# Patient Record
Sex: Male | Born: 1950 | Race: Black or African American | Hispanic: No | Marital: Married | Smoking: Former smoker
Health system: Southern US, Community
[De-identification: ages and names within clinical notes are randomized; demographics above are authoritative.]

## PROBLEM LIST (undated history)

## (undated) DIAGNOSIS — Z973 Presence of spectacles and contact lenses: Secondary | ICD-10-CM

## (undated) DIAGNOSIS — E785 Hyperlipidemia, unspecified: Secondary | ICD-10-CM

## (undated) DIAGNOSIS — I10 Essential (primary) hypertension: Secondary | ICD-10-CM

## (undated) DIAGNOSIS — R0683 Snoring: Secondary | ICD-10-CM

## (undated) DIAGNOSIS — H9190 Unspecified hearing loss, unspecified ear: Secondary | ICD-10-CM

## (undated) DIAGNOSIS — Z974 Presence of external hearing-aid: Secondary | ICD-10-CM

## (undated) DIAGNOSIS — K219 Gastro-esophageal reflux disease without esophagitis: Secondary | ICD-10-CM

## (undated) HISTORY — PX: TONSILLECTOMY: SUR1361

## (undated) HISTORY — PX: COLONOSCOPY: SHX174

---

## 2007-03-08 HISTORY — PX: KNEE ARTHROSCOPY: SUR90

## 2013-02-26 ENCOUNTER — Other Ambulatory Visit: Payer: Self-pay | Admitting: Orthopedic Surgery

## 2013-03-18 ENCOUNTER — Encounter (HOSPITAL_BASED_OUTPATIENT_CLINIC_OR_DEPARTMENT_OTHER): Payer: Self-pay | Admitting: *Deleted

## 2013-03-18 NOTE — Progress Notes (Signed)
03/18/13 [place]  Have you ever been diagnosed with sleep apnea through a sleep study? No  Do you snore loudly (loud enough to be heard through closed doors)?  1  Do you often feel tired, fatigued, or sleepy during the daytime? 0  Has anyone observed you stop breathing during your sleep? 0  Do you have, or are you being treated for high blood pressure? 1  BMI more than 35 kg/m2? 0  Age over 61 years old? 1  Neck circumference greater than 40 cm/18 inches? 0  Gender: 1  Obstructive Sleep Apnea Score 4  Score 4 or greater  Results sent to PCP

## 2013-03-18 NOTE — Progress Notes (Signed)
To come in for ekg-bmet 

## 2013-03-19 ENCOUNTER — Encounter (HOSPITAL_BASED_OUTPATIENT_CLINIC_OR_DEPARTMENT_OTHER)
Admission: RE | Admit: 2013-03-19 | Discharge: 2013-03-19 | Disposition: A | Discharge status: Home / Self Care | Payer: BC Managed Care – PPO | Source: Ambulatory Visit | Attending: Orthopedic Surgery | Admitting: Orthopedic Surgery

## 2013-03-19 DIAGNOSIS — Z01818 Encounter for other preprocedural examination: Secondary | ICD-10-CM | POA: Insufficient documentation

## 2013-03-19 DIAGNOSIS — Z01812 Encounter for preprocedural laboratory examination: Secondary | ICD-10-CM | POA: Insufficient documentation

## 2013-03-19 DIAGNOSIS — Z01810 Encounter for preprocedural cardiovascular examination: Secondary | ICD-10-CM | POA: Insufficient documentation

## 2013-03-19 LAB — BASIC METABOLIC PANEL
BUN: 14 mg/dL (ref 6–23)
CHLORIDE: 95 meq/L — AB (ref 96–112)
CO2: 30 meq/L (ref 19–32)
CREATININE: 0.84 mg/dL (ref 0.50–1.35)
Calcium: 9.5 mg/dL (ref 8.4–10.5)
GFR calc Af Amer: >90 mL/min (ref >90)
GFR calc non Af Amer: >90 mL/min (ref >90)
Glucose, Bld: 105 mg/dL — ABNORMAL HIGH (ref 70–99)
Potassium: 3.9 mEq/L (ref 3.7–5.3)
SODIUM: 138 meq/L (ref 137–147)

## 2013-03-21 ENCOUNTER — Ambulatory Visit (HOSPITAL_BASED_OUTPATIENT_CLINIC_OR_DEPARTMENT_OTHER)
Admission: RE | Admit: 2013-03-21 | Payer: BC Managed Care – PPO | Source: Ambulatory Visit | Admitting: Orthopedic Surgery

## 2013-03-21 HISTORY — DX: Presence of spectacles and contact lenses: Z97.3

## 2013-03-21 HISTORY — DX: Essential (primary) hypertension: I10

## 2013-03-21 HISTORY — DX: Gastro-esophageal reflux disease without esophagitis: K21.9

## 2013-03-21 HISTORY — DX: Unspecified hearing loss, unspecified ear: H91.90

## 2013-03-21 HISTORY — DX: Hyperlipidemia, unspecified: E78.5

## 2013-03-21 HISTORY — DX: Presence of external hearing-aid: Z97.4

## 2013-03-21 HISTORY — DX: Snoring: R06.83

## 2013-03-21 SURGERY — REPAIR, TENDON, EXTENSOR
Anesthesia: Choice | Site: Hand | Laterality: Left

## 2013-03-25 ENCOUNTER — Encounter (HOSPITAL_BASED_OUTPATIENT_CLINIC_OR_DEPARTMENT_OTHER): Payer: Self-pay | Admitting: *Deleted

## 2013-03-25 NOTE — Progress Notes (Signed)
Surgery r/s due to dr sick-had ekg and bmet done

## 2013-03-28 ENCOUNTER — Encounter (HOSPITAL_BASED_OUTPATIENT_CLINIC_OR_DEPARTMENT_OTHER): Payer: Self-pay | Admitting: Anesthesiology

## 2013-03-28 ENCOUNTER — Encounter (HOSPITAL_BASED_OUTPATIENT_CLINIC_OR_DEPARTMENT_OTHER): Payer: BC Managed Care – PPO | Admitting: Anesthesiology

## 2013-03-28 ENCOUNTER — Ambulatory Visit (HOSPITAL_BASED_OUTPATIENT_CLINIC_OR_DEPARTMENT_OTHER)
Admission: RE | Admit: 2013-03-28 | Discharge: 2013-03-28 | Disposition: A | Discharge status: Home / Self Care | Payer: BC Managed Care – PPO | Source: Ambulatory Visit | Attending: Orthopedic Surgery | Admitting: Orthopedic Surgery

## 2013-03-28 ENCOUNTER — Ambulatory Visit (HOSPITAL_BASED_OUTPATIENT_CLINIC_OR_DEPARTMENT_OTHER): Payer: BC Managed Care – PPO | Admitting: Anesthesiology

## 2013-03-28 ENCOUNTER — Encounter (HOSPITAL_BASED_OUTPATIENT_CLINIC_OR_DEPARTMENT_OTHER)
Admission: RE | Disposition: A | Discharge status: Home / Self Care | Payer: Self-pay | Source: Ambulatory Visit | Attending: Orthopedic Surgery

## 2013-03-28 DIAGNOSIS — W261XXA Contact with sword or dagger, initial encounter: Secondary | ICD-10-CM

## 2013-03-28 DIAGNOSIS — S61209A Unspecified open wound of unspecified finger without damage to nail, initial encounter: Secondary | ICD-10-CM | POA: Insufficient documentation

## 2013-03-28 DIAGNOSIS — W260XXA Contact with knife, initial encounter: Secondary | ICD-10-CM | POA: Insufficient documentation

## 2013-03-28 DIAGNOSIS — K219 Gastro-esophageal reflux disease without esophagitis: Secondary | ICD-10-CM | POA: Insufficient documentation

## 2013-03-28 DIAGNOSIS — Z87891 Personal history of nicotine dependence: Secondary | ICD-10-CM | POA: Insufficient documentation

## 2013-03-28 DIAGNOSIS — E785 Hyperlipidemia, unspecified: Secondary | ICD-10-CM | POA: Insufficient documentation

## 2013-03-28 DIAGNOSIS — I10 Essential (primary) hypertension: Secondary | ICD-10-CM | POA: Insufficient documentation

## 2013-03-28 HISTORY — PX: TENDON TRANSFER: SHX6109

## 2013-03-28 LAB — POCT HEMOGLOBIN-HEMACUE: Hemoglobin: 13.6 g/dL (ref 13.0–17.0)

## 2013-03-28 SURGERY — TRANSFER, TENDON
Anesthesia: General | Site: Hand | Laterality: Left

## 2013-03-28 MED ORDER — ONDANSETRON HCL 4 MG/2ML IJ SOLN
INTRAMUSCULAR | Status: DC | PRN
  Administered 2013-03-28: 4 mg via INTRAVENOUS

## 2013-03-28 MED ORDER — MIDAZOLAM HCL 2 MG/2ML IJ SOLN
INTRAMUSCULAR | Status: AC
  Filled 2013-03-28: qty 2

## 2013-03-28 MED ORDER — MIDAZOLAM HCL 2 MG/2ML IJ SOLN: 1.0000 mg | INTRAMUSCULAR | Status: DC | PRN

## 2013-03-28 MED ORDER — PROMETHAZINE HCL 25 MG/ML IJ SOLN: 6.2500 mg | INTRAMUSCULAR | Status: DC | PRN

## 2013-03-28 MED ORDER — PROPOFOL 10 MG/ML IV BOLUS
INTRAVENOUS | Status: DC | PRN
  Administered 2013-03-28: 250 mg via INTRAVENOUS

## 2013-03-28 MED ORDER — OXYCODONE HCL 5 MG/5ML PO SOLN: 5.0000 mg | Freq: Once | ORAL | Status: DC | PRN

## 2013-03-28 MED ORDER — FENTANYL CITRATE 0.05 MG/ML IJ SOLN
INTRAMUSCULAR | Status: AC
  Filled 2013-03-28: qty 4

## 2013-03-28 MED ORDER — ACETAMINOPHEN-CODEINE #3 300-30 MG PO TABS
1.0000 | ORAL_TABLET | Freq: Once | ORAL | Status: AC | PRN
  Administered 2013-03-28: 1 via ORAL
  Filled 2013-03-28: qty 1

## 2013-03-28 MED ORDER — CEFAZOLIN SODIUM-DEXTROSE 2-3 GM-% IV SOLR
2.0000 g | INTRAVENOUS | Status: AC
  Administered 2013-03-28: 2 g via INTRAVENOUS

## 2013-03-28 MED ORDER — DEXAMETHASONE SODIUM PHOSPHATE 10 MG/ML IJ SOLN
INTRAMUSCULAR | Status: DC | PRN
  Administered 2013-03-28: 10 mg via INTRAVENOUS

## 2013-03-28 MED ORDER — CEFAZOLIN SODIUM-DEXTROSE 2-3 GM-% IV SOLR
INTRAVENOUS | Status: AC
  Filled 2013-03-28: qty 50

## 2013-03-28 MED ORDER — LIDOCAINE HCL (CARDIAC) 20 MG/ML IV SOLN
INTRAVENOUS | Status: DC | PRN
  Administered 2013-03-28: 50 mg via INTRAVENOUS

## 2013-03-28 MED ORDER — FENTANYL CITRATE 0.05 MG/ML IJ SOLN: 50.0000 ug | Freq: Once | INTRAMUSCULAR | Status: DC

## 2013-03-28 MED ORDER — BUPIVACAINE HCL (PF) 0.25 % IJ SOLN
INTRAMUSCULAR | Status: DC | PRN
  Administered 2013-03-28: 6 mL

## 2013-03-28 MED ORDER — ACETAMINOPHEN-CODEINE #3 300-30 MG PO TABS: ORAL_TABLET | ORAL | Status: AC

## 2013-03-28 MED ORDER — FENTANYL CITRATE 0.05 MG/ML IJ SOLN
INTRAMUSCULAR | Status: DC | PRN
  Administered 2013-03-28: 100 ug via INTRAVENOUS

## 2013-03-28 MED ORDER — FENTANYL CITRATE 0.05 MG/ML IJ SOLN: 50.0000 ug | INTRAMUSCULAR | Status: DC | PRN

## 2013-03-28 MED ORDER — 0.9 % SODIUM CHLORIDE (POUR BTL) OPTIME
TOPICAL | Status: DC | PRN
  Administered 2013-03-28: 200 mL

## 2013-03-28 MED ORDER — MIDAZOLAM HCL 5 MG/5ML IJ SOLN
INTRAMUSCULAR | Status: DC | PRN
  Administered 2013-03-28: 2 mg via INTRAVENOUS

## 2013-03-28 MED ORDER — OXYCODONE HCL 5 MG PO TABS
5.0000 mg | ORAL_TABLET | Freq: Once | ORAL | Status: DC | PRN
  Filled 2013-03-28: qty 1

## 2013-03-28 MED ORDER — LACTATED RINGERS IV SOLN
INTRAVENOUS | Status: DC
  Administered 2013-03-28: 09:00:00 via INTRAVENOUS

## 2013-03-28 MED ORDER — CHLORHEXIDINE GLUCONATE 4 % EX LIQD: 60.0000 mL | Freq: Once | CUTANEOUS | Status: DC

## 2013-03-28 MED ORDER — FENTANYL CITRATE 0.05 MG/ML IJ SOLN: 25.0000 ug | INTRAMUSCULAR | Status: DC | PRN

## 2013-03-28 SURGICAL SUPPLY — 81 items
BAG DECANTER FOR FLEXI CONT (MISCELLANEOUS) IMPLANT
BANDAGE ELASTIC 3 VELCRO ST LF (GAUZE/BANDAGES/DRESSINGS) ×3 IMPLANT
BLADE MINI RND TIP GREEN BEAV (BLADE) IMPLANT
BLADE SURG 15 STRL LF DISP TIS (BLADE) ×2 IMPLANT
BLADE SURG 15 STRL SS (BLADE) ×4
BNDG CONFORM 2 STRL LF (GAUZE/BANDAGES/DRESSINGS) IMPLANT
BNDG ELASTIC 2 VLCR STRL LF (GAUZE/BANDAGES/DRESSINGS) IMPLANT
BNDG ESMARK 4X9 LF (GAUZE/BANDAGES/DRESSINGS) ×3 IMPLANT
BNDG GAUZE ELAST 4 BULKY (GAUZE/BANDAGES/DRESSINGS) IMPLANT
CHLORAPREP W/TINT 26ML (MISCELLANEOUS) ×3 IMPLANT
CORDS BIPOLAR (ELECTRODE) ×3 IMPLANT
COTTONBALL LRG STERILE PKG (GAUZE/BANDAGES/DRESSINGS) IMPLANT
COVER MAYO STAND STRL (DRAPES) ×3 IMPLANT
COVER TABLE BACK 60X90 (DRAPES) ×3 IMPLANT
CUFF TOURNIQUET SINGLE 18IN (TOURNIQUET CUFF) ×3 IMPLANT
DECANTER SPIKE VIAL GLASS SM (MISCELLANEOUS) IMPLANT
DRAIN TLS ROUND 10FR (DRAIN) IMPLANT
DRAPE EXTREMITY T 121X128X90 (DRAPE) ×3 IMPLANT
DRAPE OEC MINIVIEW 54X84 (DRAPES) IMPLANT
DRAPE SURG 17X23 STRL (DRAPES) ×3 IMPLANT
GAUZE SPONGE 4X4 16PLY XRAY LF (GAUZE/BANDAGES/DRESSINGS) IMPLANT
GAUZE XEROFORM 1X8 LF (GAUZE/BANDAGES/DRESSINGS) ×3 IMPLANT
GLOVE BIO SURGEON STRL SZ7.5 (GLOVE) ×3 IMPLANT
GLOVE BIOGEL M STRL SZ7.5 (GLOVE) ×3 IMPLANT
GLOVE BIOGEL PI IND STRL 8 (GLOVE) ×2 IMPLANT
GLOVE BIOGEL PI INDICATOR 8 (GLOVE) ×4
GLOVE SURG ORTHO 8.0 STRL STRW (GLOVE) IMPLANT
GOWN STRL REUS W/ TWL LRG LVL3 (GOWN DISPOSABLE) IMPLANT
GOWN STRL REUS W/ TWL XL LVL3 (GOWN DISPOSABLE) ×2 IMPLANT
GOWN STRL REUS W/TWL LRG LVL3 (GOWN DISPOSABLE)
GOWN STRL REUS W/TWL XL LVL3 (GOWN DISPOSABLE) ×4 IMPLANT
K-WIRE .035X4 (WIRE) IMPLANT
LOOP VESSEL MAXI BLUE (MISCELLANEOUS) IMPLANT
NEEDLE HYPO 22GX1.5 SAFETY (NEEDLE) ×3 IMPLANT
NEEDLE HYPO 25X1 1.5 SAFETY (NEEDLE) ×3 IMPLANT
NEEDLE KEITH (NEEDLE) IMPLANT
NS IRRIG 1000ML POUR BTL (IV SOLUTION) ×3 IMPLANT
PACK BASIN DAY SURGERY FS (CUSTOM PROCEDURE TRAY) ×3 IMPLANT
PAD ABD 8X10 STRL (GAUZE/BANDAGES/DRESSINGS) IMPLANT
PAD CAST 3X4 CTTN HI CHSV (CAST SUPPLIES) ×1 IMPLANT
PAD CAST 4YDX4 CTTN HI CHSV (CAST SUPPLIES) IMPLANT
PADDING CAST ABS 3INX4YD NS (CAST SUPPLIES)
PADDING CAST ABS 4INX4YD NS (CAST SUPPLIES)
PADDING CAST ABS COTTON 3X4 (CAST SUPPLIES) IMPLANT
PADDING CAST ABS COTTON 4X4 ST (CAST SUPPLIES) IMPLANT
PADDING CAST COTTON 3X4 STRL (CAST SUPPLIES) ×2
PADDING CAST COTTON 4X4 STRL (CAST SUPPLIES)
SLEEVE SCD COMPRESS KNEE MED (MISCELLANEOUS) ×3 IMPLANT
SPLINT PLASTER CAST XFAST 3X15 (CAST SUPPLIES) ×10 IMPLANT
SPLINT PLASTER CAST XFAST 4X15 (CAST SUPPLIES) IMPLANT
SPLINT PLASTER XTRA FAST SET 4 (CAST SUPPLIES)
SPLINT PLASTER XTRA FASTSET 3X (CAST SUPPLIES) ×20
SPONGE GAUZE 4X4 12PLY (GAUZE/BANDAGES/DRESSINGS) ×3 IMPLANT
STOCKINETTE 4X48 STRL (DRAPES) ×3 IMPLANT
SUT CHROMIC 5 0 P 3 (SUTURE) IMPLANT
SUT ETHIBOND 3-0 V-5 (SUTURE) IMPLANT
SUT ETHILON 3 0 PS 1 (SUTURE) IMPLANT
SUT ETHILON 4 0 PS 2 18 (SUTURE) IMPLANT
SUT FIBERWIRE 3-0 18 TAPR NDL (SUTURE)
SUT FIBERWIRE 4-0 18 DIAM BLUE (SUTURE) ×3
SUT MERSILENE 2.0 SH NDLE (SUTURE) IMPLANT
SUT MERSILENE 3 0 FS 1 (SUTURE) IMPLANT
SUT MERSILENE 4 0 P 3 (SUTURE) IMPLANT
SUT POLY BUTTON 15MM (SUTURE) IMPLANT
SUT PROLENE 2 0 SH DA (SUTURE) IMPLANT
SUT PROLENE 6 0 P 1 18 (SUTURE) IMPLANT
SUT SILK 2 0 FS (SUTURE) IMPLANT
SUT SILK 4 0 PS 2 (SUTURE) ×3 IMPLANT
SUT STEEL 4 0 V 26 (SUTURE) IMPLANT
SUT VIC AB 3-0 PS1 18 (SUTURE)
SUT VIC AB 3-0 PS1 18XBRD (SUTURE) IMPLANT
SUT VIC AB 4-0 P-3 18XBRD (SUTURE) IMPLANT
SUT VIC AB 4-0 P3 18 (SUTURE)
SUT VICRYL 4-0 PS2 18IN ABS (SUTURE) IMPLANT
SUTURE FIBERWR 3-0 18 TAPR NDL (SUTURE) IMPLANT
SUTURE FIBERWR 4-0 18 DIA BLUE (SUTURE) ×1 IMPLANT
SYR BULB 3OZ (MISCELLANEOUS) ×3 IMPLANT
SYR CONTROL 10ML LL (SYRINGE) ×3 IMPLANT
TOWEL OR 17X24 6PK STRL BLUE (TOWEL DISPOSABLE) ×6 IMPLANT
TUBE FEEDING 5FR 15 INCH (TUBING) IMPLANT
UNDERPAD 30X30 INCONTINENT (UNDERPADS AND DIAPERS) ×3 IMPLANT

## 2013-03-28 NOTE — Anesthesia Preprocedure Evaluation (Signed)
Anesthesia Evaluation  Patient identified by MRN, date of birth, ID band Patient awake    Airway Mallampati: II TM Distance: >3 FB Neck ROM: Full    Dental   Pulmonary former smoker,  breath sounds clear to auscultation        Cardiovascular hypertension, Rhythm:Regular Rate:Normal     Neuro/Psych    GI/Hepatic GERD-  Controlled,  Endo/Other    Renal/GU      Musculoskeletal   Abdominal (+) + obese,   Peds  Hematology   Anesthesia Other Findings   Reproductive/Obstetrics                           Anesthesia Physical Anesthesia Plan  ASA: II  Anesthesia Plan: General   Post-op Pain Management:    Induction: Intravenous  Airway Management Planned: LMA  Additional Equipment:   Intra-op Plan:   Post-operative Plan: Extubation in OR  Informed Consent: I have reviewed the patients History and Physical, chart, labs and discussed the procedure including the risks, benefits and alternatives for the proposed anesthesia with the patient or authorized representative who has indicated his/her understanding and acceptance.     Plan Discussed with: CRNA and Surgeon  Anesthesia Plan Comments:         Anesthesia Quick Evaluation

## 2013-03-28 NOTE — Brief Op Note (Signed)
03/28/2013  10:14 AM  PATIENT:  Mark Howard  63 y.o. male  PRE-OPERATIVE DIAGNOSIS:  LEFT LONG EXTENSOR TENDON LACERATION  POST-OPERATIVE DIAGNOSIS:  LEFT LONG EXTENSOR TENDON LACERATION  PROCEDURE:  Procedure(s): Left Long Finger Extensor Tendon Repair (Left)  SURGEON:  Surgeon(s) and Role:    * Tennis Must, MD - Primary  PHYSICIAN ASSISTANT:   ASSISTANTS: none   ANESTHESIA:   general  EBL:  Total I/O In: 700 [I.V.:700] Out: -   BLOOD ADMINISTERED:none  DRAINS: none   LOCAL MEDICATIONS USED:  MARCAINE     SPECIMEN:  No Specimen  DISPOSITION OF SPECIMEN:  N/A  COUNTS:  YES  TOURNIQUET:   Total Tourniquet Time Documented: area (Left) - 26 minutes Total: area (Left) - 26 minutes   DICTATION: .Other Dictation: Dictation Number [id number]  PLAN OF CARE: Discharge to home after PACU  PATIENT DISPOSITION:  PACU - hemodynamically stable.

## 2013-03-28 NOTE — Transfer of Care (Signed)
Immediate Anesthesia Transfer of Care Note  Patient: Mark Howard  Procedure(s) Performed: Procedure(s): Left Long Finger Extensor Tendon Repair (Left)  Patient Location: PACU  Anesthesia Type:General  Level of Consciousness: sedated  Airway & Oxygen Therapy: Patient Spontanous Breathing and Patient connected to face mask oxygen  Post-op Assessment: Report given to PACU RN and Post -op Vital signs reviewed and stable  Post vital signs: Reviewed and stable  Complications: No apparent anesthesia complications

## 2013-03-28 NOTE — Anesthesia Procedure Notes (Signed)
Procedure Name: LMA Insertion Date/Time: 03/28/2013 9:32 AM Performed by: Lieutenant Diego Pre-anesthesia Checklist: Patient identified, Emergency Drugs available, Suction available and Patient being monitored Patient Re-evaluated:Patient Re-evaluated prior to inductionOxygen Delivery Method: Circle System Utilized Preoxygenation: Pre-oxygenation with 100% oxygen Intubation Type: IV induction Ventilation: Mask ventilation without difficulty LMA: LMA inserted LMA Size: 5.0 Number of attempts: 1 Airway Equipment and Method: bite block Placement Confirmation: positive ETCO2 and breath sounds checked- equal and bilateral Tube secured with: Tape Dental Injury: Teeth and Oropharynx as per pre-operative assessment

## 2013-03-28 NOTE — Discharge Instructions (Addendum)

## 2013-03-28 NOTE — Op Note (Signed)
[id number] 

## 2013-03-28 NOTE — Anesthesia Postprocedure Evaluation (Signed)
  Anesthesia Post-op Note  Patient: Mark Howard  Procedure(s) Performed: Procedure(s): Left Long Finger Extensor Tendon Repair (Left)  Patient Location: PACU  Anesthesia Type:General  Level of Consciousness: awake and alert   Airway and Oxygen Therapy: Patient Spontanous Breathing  Post-op Pain: mild  Post-op Assessment: Post-op Vital signs reviewed, Patient's Cardiovascular Status Stable, Respiratory Function Stable, Patent Airway, No signs of Nausea or vomiting and Pain level controlled  Post-op Vital Signs: Reviewed and stable  Complications: No apparent anesthesia complications
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
## 2013-03-29 ENCOUNTER — Encounter (HOSPITAL_BASED_OUTPATIENT_CLINIC_OR_DEPARTMENT_OTHER): Payer: Self-pay | Admitting: Orthopedic Surgery
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
## 2018-01-09 DIAGNOSIS — Z6830 Body mass index (BMI) 30.0-30.9, adult: Secondary | ICD-10-CM | POA: Diagnosis not present

## 2018-01-09 DIAGNOSIS — E669 Obesity, unspecified: Secondary | ICD-10-CM | POA: Diagnosis not present

## 2018-01-16 DIAGNOSIS — Z6830 Body mass index (BMI) 30.0-30.9, adult: Secondary | ICD-10-CM | POA: Diagnosis not present

## 2018-01-16 DIAGNOSIS — E669 Obesity, unspecified: Secondary | ICD-10-CM | POA: Diagnosis not present

## 2018-01-23 DIAGNOSIS — Z6830 Body mass index (BMI) 30.0-30.9, adult: Secondary | ICD-10-CM | POA: Diagnosis not present

## 2018-01-23 DIAGNOSIS — E669 Obesity, unspecified: Secondary | ICD-10-CM | POA: Diagnosis not present

## 2018-01-30 DIAGNOSIS — Z6831 Body mass index (BMI) 31.0-31.9, adult: Secondary | ICD-10-CM | POA: Diagnosis not present

## 2018-01-30 DIAGNOSIS — E669 Obesity, unspecified: Secondary | ICD-10-CM | POA: Diagnosis not present

## 2018-02-13 DIAGNOSIS — Z6830 Body mass index (BMI) 30.0-30.9, adult: Secondary | ICD-10-CM | POA: Diagnosis not present

## 2018-02-13 DIAGNOSIS — E669 Obesity, unspecified: Secondary | ICD-10-CM | POA: Diagnosis not present

## 2018-03-06 DIAGNOSIS — E669 Obesity, unspecified: Secondary | ICD-10-CM | POA: Diagnosis not present

## 2018-03-06 DIAGNOSIS — Z6831 Body mass index (BMI) 31.0-31.9, adult: Secondary | ICD-10-CM | POA: Diagnosis not present

## 2018-03-20 DIAGNOSIS — E669 Obesity, unspecified: Secondary | ICD-10-CM | POA: Diagnosis not present

## 2018-03-20 DIAGNOSIS — Z6830 Body mass index (BMI) 30.0-30.9, adult: Secondary | ICD-10-CM | POA: Diagnosis not present

## 2018-04-25 DIAGNOSIS — I10 Essential (primary) hypertension: Secondary | ICD-10-CM | POA: Diagnosis not present

## 2018-04-25 DIAGNOSIS — E119 Type 2 diabetes mellitus without complications: Secondary | ICD-10-CM | POA: Diagnosis not present

## 2018-04-25 DIAGNOSIS — E785 Hyperlipidemia, unspecified: Secondary | ICD-10-CM | POA: Diagnosis not present

## 2018-05-01 DIAGNOSIS — Z6830 Body mass index (BMI) 30.0-30.9, adult: Secondary | ICD-10-CM | POA: Diagnosis not present

## 2018-05-01 DIAGNOSIS — E669 Obesity, unspecified: Secondary | ICD-10-CM | POA: Diagnosis not present

## 2018-05-02 DIAGNOSIS — Z139 Encounter for screening, unspecified: Secondary | ICD-10-CM | POA: Diagnosis not present

## 2018-05-02 DIAGNOSIS — Z1331 Encounter for screening for depression: Secondary | ICD-10-CM | POA: Diagnosis not present

## 2018-05-02 DIAGNOSIS — I10 Essential (primary) hypertension: Secondary | ICD-10-CM | POA: Diagnosis not present

## 2018-05-02 DIAGNOSIS — E119 Type 2 diabetes mellitus without complications: Secondary | ICD-10-CM | POA: Diagnosis not present

## 2018-05-02 DIAGNOSIS — E785 Hyperlipidemia, unspecified: Secondary | ICD-10-CM | POA: Diagnosis not present

## 2018-05-15 DIAGNOSIS — Z6830 Body mass index (BMI) 30.0-30.9, adult: Secondary | ICD-10-CM | POA: Diagnosis not present

## 2018-05-15 DIAGNOSIS — E669 Obesity, unspecified: Secondary | ICD-10-CM | POA: Diagnosis not present

## 2018-06-27 DIAGNOSIS — H9319 Tinnitus, unspecified ear: Secondary | ICD-10-CM | POA: Diagnosis not present

## 2018-06-27 DIAGNOSIS — H938X9 Other specified disorders of ear, unspecified ear: Secondary | ICD-10-CM | POA: Diagnosis not present

## 2018-07-17 DIAGNOSIS — H938X1 Other specified disorders of right ear: Secondary | ICD-10-CM | POA: Diagnosis not present

## 2018-07-17 DIAGNOSIS — D333 Benign neoplasm of cranial nerves: Secondary | ICD-10-CM | POA: Diagnosis not present

## 2018-07-17 DIAGNOSIS — H9319 Tinnitus, unspecified ear: Secondary | ICD-10-CM | POA: Diagnosis not present

## 2018-07-17 DIAGNOSIS — Z77122 Contact with and (suspected) exposure to noise: Secondary | ICD-10-CM | POA: Diagnosis not present

## 2018-07-17 DIAGNOSIS — H6122 Impacted cerumen, left ear: Secondary | ICD-10-CM | POA: Diagnosis not present

## 2018-08-01 DIAGNOSIS — S30860A Insect bite (nonvenomous) of lower back and pelvis, initial encounter: Secondary | ICD-10-CM | POA: Diagnosis not present

## 2018-08-01 DIAGNOSIS — E119 Type 2 diabetes mellitus without complications: Secondary | ICD-10-CM | POA: Diagnosis not present

## 2018-08-01 DIAGNOSIS — W57XXXA Bitten or stung by nonvenomous insect and other nonvenomous arthropods, initial encounter: Secondary | ICD-10-CM | POA: Diagnosis not present

## 2018-08-01 DIAGNOSIS — L299 Pruritus, unspecified: Secondary | ICD-10-CM | POA: Diagnosis not present

## 2018-08-02 DIAGNOSIS — E785 Hyperlipidemia, unspecified: Secondary | ICD-10-CM | POA: Diagnosis not present

## 2018-08-02 DIAGNOSIS — I10 Essential (primary) hypertension: Secondary | ICD-10-CM | POA: Diagnosis not present

## 2018-08-02 DIAGNOSIS — E119 Type 2 diabetes mellitus without complications: Secondary | ICD-10-CM | POA: Diagnosis not present

## 2018-08-08 DIAGNOSIS — I700 Atherosclerosis of aorta: Secondary | ICD-10-CM | POA: Diagnosis not present

## 2018-08-08 DIAGNOSIS — E785 Hyperlipidemia, unspecified: Secondary | ICD-10-CM | POA: Diagnosis not present

## 2018-08-08 DIAGNOSIS — I10 Essential (primary) hypertension: Secondary | ICD-10-CM | POA: Diagnosis not present

## 2018-08-08 DIAGNOSIS — E119 Type 2 diabetes mellitus without complications: Secondary | ICD-10-CM | POA: Diagnosis not present

## 2018-08-08 DIAGNOSIS — Z139 Encounter for screening, unspecified: Secondary | ICD-10-CM | POA: Diagnosis not present

## 2018-08-22 DIAGNOSIS — H6990 Unspecified Eustachian tube disorder, unspecified ear: Secondary | ICD-10-CM | POA: Diagnosis not present

## 2018-08-22 DIAGNOSIS — L501 Idiopathic urticaria: Secondary | ICD-10-CM | POA: Diagnosis not present

## 2018-08-22 DIAGNOSIS — Z6830 Body mass index (BMI) 30.0-30.9, adult: Secondary | ICD-10-CM | POA: Diagnosis not present

## 2018-09-19 DIAGNOSIS — H9319 Tinnitus, unspecified ear: Secondary | ICD-10-CM | POA: Diagnosis not present

## 2018-09-19 DIAGNOSIS — H938X9 Other specified disorders of ear, unspecified ear: Secondary | ICD-10-CM | POA: Diagnosis not present

## 2018-09-19 DIAGNOSIS — Z8669 Personal history of other diseases of the nervous system and sense organs: Secondary | ICD-10-CM | POA: Diagnosis not present

## 2018-09-19 DIAGNOSIS — D333 Benign neoplasm of cranial nerves: Secondary | ICD-10-CM | POA: Diagnosis not present

## 2018-10-31 DIAGNOSIS — I10 Essential (primary) hypertension: Secondary | ICD-10-CM | POA: Diagnosis not present

## 2018-10-31 DIAGNOSIS — E119 Type 2 diabetes mellitus without complications: Secondary | ICD-10-CM | POA: Diagnosis not present

## 2018-10-31 DIAGNOSIS — E785 Hyperlipidemia, unspecified: Secondary | ICD-10-CM | POA: Diagnosis not present

## 2018-11-07 DIAGNOSIS — E785 Hyperlipidemia, unspecified: Secondary | ICD-10-CM | POA: Diagnosis not present

## 2018-11-07 DIAGNOSIS — E1159 Type 2 diabetes mellitus with other circulatory complications: Secondary | ICD-10-CM | POA: Diagnosis not present

## 2018-11-07 DIAGNOSIS — I10 Essential (primary) hypertension: Secondary | ICD-10-CM | POA: Diagnosis not present

## 2018-11-07 DIAGNOSIS — E1169 Type 2 diabetes mellitus with other specified complication: Secondary | ICD-10-CM | POA: Diagnosis not present

## 2018-11-21 DIAGNOSIS — Z6831 Body mass index (BMI) 31.0-31.9, adult: Secondary | ICD-10-CM | POA: Diagnosis not present

## 2018-11-21 DIAGNOSIS — Z1331 Encounter for screening for depression: Secondary | ICD-10-CM | POA: Diagnosis not present

## 2018-11-21 DIAGNOSIS — Z0000 Encounter for general adult medical examination without abnormal findings: Secondary | ICD-10-CM | POA: Diagnosis not present

## 2018-12-13 DIAGNOSIS — Z23 Encounter for immunization: Secondary | ICD-10-CM | POA: Diagnosis not present

## 2019-04-10 DIAGNOSIS — Z20822 Contact with and (suspected) exposure to covid-19: Secondary | ICD-10-CM | POA: Diagnosis not present

## 2019-10-09 DIAGNOSIS — I10 Essential (primary) hypertension: Secondary | ICD-10-CM | POA: Diagnosis not present

## 2019-10-09 DIAGNOSIS — Z125 Encounter for screening for malignant neoplasm of prostate: Secondary | ICD-10-CM | POA: Diagnosis not present

## 2019-10-09 DIAGNOSIS — E119 Type 2 diabetes mellitus without complications: Secondary | ICD-10-CM | POA: Diagnosis not present

## 2019-10-09 DIAGNOSIS — E785 Hyperlipidemia, unspecified: Secondary | ICD-10-CM | POA: Diagnosis not present

## 2019-10-18 DIAGNOSIS — I10 Essential (primary) hypertension: Secondary | ICD-10-CM | POA: Diagnosis not present

## 2019-10-18 DIAGNOSIS — E1169 Type 2 diabetes mellitus with other specified complication: Secondary | ICD-10-CM | POA: Diagnosis not present

## 2019-10-18 DIAGNOSIS — R972 Elevated prostate specific antigen [PSA]: Secondary | ICD-10-CM | POA: Diagnosis not present

## 2019-10-18 DIAGNOSIS — E785 Hyperlipidemia, unspecified: Secondary | ICD-10-CM | POA: Diagnosis not present

## 2019-10-24 DIAGNOSIS — Z6830 Body mass index (BMI) 30.0-30.9, adult: Secondary | ICD-10-CM | POA: Diagnosis not present

## 2019-10-24 DIAGNOSIS — E669 Obesity, unspecified: Secondary | ICD-10-CM | POA: Diagnosis not present

## 2019-10-31 DIAGNOSIS — E669 Obesity, unspecified: Secondary | ICD-10-CM | POA: Diagnosis not present

## 2019-10-31 DIAGNOSIS — Z6830 Body mass index (BMI) 30.0-30.9, adult: Secondary | ICD-10-CM | POA: Diagnosis not present

## 2019-11-07 DIAGNOSIS — E669 Obesity, unspecified: Secondary | ICD-10-CM | POA: Diagnosis not present

## 2019-11-07 DIAGNOSIS — Z6830 Body mass index (BMI) 30.0-30.9, adult: Secondary | ICD-10-CM | POA: Diagnosis not present

## 2019-11-14 DIAGNOSIS — Z6830 Body mass index (BMI) 30.0-30.9, adult: Secondary | ICD-10-CM | POA: Diagnosis not present

## 2019-11-14 DIAGNOSIS — E669 Obesity, unspecified: Secondary | ICD-10-CM | POA: Diagnosis not present

## 2019-11-21 DIAGNOSIS — Z6830 Body mass index (BMI) 30.0-30.9, adult: Secondary | ICD-10-CM | POA: Diagnosis not present

## 2019-11-21 DIAGNOSIS — E669 Obesity, unspecified: Secondary | ICD-10-CM | POA: Diagnosis not present

## 2019-12-05 DIAGNOSIS — E669 Obesity, unspecified: Secondary | ICD-10-CM | POA: Diagnosis not present

## 2019-12-05 DIAGNOSIS — Z6830 Body mass index (BMI) 30.0-30.9, adult: Secondary | ICD-10-CM | POA: Diagnosis not present

## 2019-12-19 DIAGNOSIS — E669 Obesity, unspecified: Secondary | ICD-10-CM | POA: Diagnosis not present

## 2019-12-19 DIAGNOSIS — Z23 Encounter for immunization: Secondary | ICD-10-CM | POA: Diagnosis not present

## 2019-12-19 DIAGNOSIS — Z6830 Body mass index (BMI) 30.0-30.9, adult: Secondary | ICD-10-CM | POA: Diagnosis not present

## 2020-01-02 DIAGNOSIS — E669 Obesity, unspecified: Secondary | ICD-10-CM | POA: Diagnosis not present

## 2020-01-02 DIAGNOSIS — Z6830 Body mass index (BMI) 30.0-30.9, adult: Secondary | ICD-10-CM | POA: Diagnosis not present

## 2020-01-15 DIAGNOSIS — Z6830 Body mass index (BMI) 30.0-30.9, adult: Secondary | ICD-10-CM | POA: Diagnosis not present

## 2020-01-15 DIAGNOSIS — E669 Obesity, unspecified: Secondary | ICD-10-CM | POA: Diagnosis not present

## 2020-01-23 DIAGNOSIS — Z6830 Body mass index (BMI) 30.0-30.9, adult: Secondary | ICD-10-CM | POA: Diagnosis not present

## 2020-01-23 DIAGNOSIS — E669 Obesity, unspecified: Secondary | ICD-10-CM | POA: Diagnosis not present

## 2020-02-06 DIAGNOSIS — Z1331 Encounter for screening for depression: Secondary | ICD-10-CM | POA: Diagnosis not present

## 2020-02-06 DIAGNOSIS — Z1339 Encounter for screening examination for other mental health and behavioral disorders: Secondary | ICD-10-CM | POA: Diagnosis not present

## 2020-02-06 DIAGNOSIS — Z139 Encounter for screening, unspecified: Secondary | ICD-10-CM | POA: Diagnosis not present

## 2020-02-06 DIAGNOSIS — Z6830 Body mass index (BMI) 30.0-30.9, adult: Secondary | ICD-10-CM | POA: Diagnosis not present

## 2020-02-06 DIAGNOSIS — Z0000 Encounter for general adult medical examination without abnormal findings: Secondary | ICD-10-CM | POA: Diagnosis not present

## 2020-02-06 DIAGNOSIS — E669 Obesity, unspecified: Secondary | ICD-10-CM | POA: Diagnosis not present

## 2020-02-06 DIAGNOSIS — Z23 Encounter for immunization: Secondary | ICD-10-CM | POA: Diagnosis not present

## 2020-02-12 DIAGNOSIS — E785 Hyperlipidemia, unspecified: Secondary | ICD-10-CM | POA: Diagnosis not present

## 2020-02-12 DIAGNOSIS — E119 Type 2 diabetes mellitus without complications: Secondary | ICD-10-CM | POA: Diagnosis not present

## 2020-02-12 DIAGNOSIS — I10 Essential (primary) hypertension: Secondary | ICD-10-CM | POA: Diagnosis not present

## 2020-02-19 DIAGNOSIS — I10 Essential (primary) hypertension: Secondary | ICD-10-CM | POA: Diagnosis not present

## 2020-02-19 DIAGNOSIS — E1169 Type 2 diabetes mellitus with other specified complication: Secondary | ICD-10-CM | POA: Diagnosis not present

## 2020-02-19 DIAGNOSIS — B353 Tinea pedis: Secondary | ICD-10-CM | POA: Diagnosis not present

## 2020-02-19 DIAGNOSIS — E785 Hyperlipidemia, unspecified: Secondary | ICD-10-CM | POA: Diagnosis not present

## 2020-02-20 DIAGNOSIS — Z6830 Body mass index (BMI) 30.0-30.9, adult: Secondary | ICD-10-CM | POA: Diagnosis not present

## 2020-02-20 DIAGNOSIS — E669 Obesity, unspecified: Secondary | ICD-10-CM | POA: Diagnosis not present

## 2020-03-27 DIAGNOSIS — Z20828 Contact with and (suspected) exposure to other viral communicable diseases: Secondary | ICD-10-CM | POA: Diagnosis not present

## 2020-04-09 DIAGNOSIS — E669 Obesity, unspecified: Secondary | ICD-10-CM | POA: Diagnosis not present

## 2020-04-09 DIAGNOSIS — Z6830 Body mass index (BMI) 30.0-30.9, adult: Secondary | ICD-10-CM | POA: Diagnosis not present

## 2020-04-10 DIAGNOSIS — H6980 Other specified disorders of Eustachian tube, unspecified ear: Secondary | ICD-10-CM | POA: Diagnosis not present

## 2020-04-10 DIAGNOSIS — Z6828 Body mass index (BMI) 28.0-28.9, adult: Secondary | ICD-10-CM | POA: Diagnosis not present

## 2020-04-23 DIAGNOSIS — E663 Overweight: Secondary | ICD-10-CM | POA: Diagnosis not present

## 2020-04-23 DIAGNOSIS — Z6828 Body mass index (BMI) 28.0-28.9, adult: Secondary | ICD-10-CM | POA: Diagnosis not present

## 2020-04-23 DIAGNOSIS — E669 Obesity, unspecified: Secondary | ICD-10-CM | POA: Diagnosis not present

## 2020-05-29 DIAGNOSIS — S61012A Laceration without foreign body of left thumb without damage to nail, initial encounter: Secondary | ICD-10-CM | POA: Diagnosis not present

## 2020-06-08 DIAGNOSIS — J343 Hypertrophy of nasal turbinates: Secondary | ICD-10-CM | POA: Diagnosis not present

## 2020-06-08 DIAGNOSIS — J342 Deviated nasal septum: Secondary | ICD-10-CM | POA: Diagnosis not present

## 2020-06-08 DIAGNOSIS — Z77122 Contact with and (suspected) exposure to noise: Secondary | ICD-10-CM | POA: Diagnosis not present

## 2020-06-08 DIAGNOSIS — D333 Benign neoplasm of cranial nerves: Secondary | ICD-10-CM | POA: Diagnosis not present

## 2020-06-08 DIAGNOSIS — H903 Sensorineural hearing loss, bilateral: Secondary | ICD-10-CM | POA: Diagnosis not present

## 2020-06-08 DIAGNOSIS — H9311 Tinnitus, right ear: Secondary | ICD-10-CM | POA: Diagnosis not present

## 2020-06-08 DIAGNOSIS — Z974 Presence of external hearing-aid: Secondary | ICD-10-CM | POA: Diagnosis not present

## 2020-06-08 DIAGNOSIS — H9191 Unspecified hearing loss, right ear: Secondary | ICD-10-CM | POA: Diagnosis not present

## 2020-06-08 DIAGNOSIS — R0981 Nasal congestion: Secondary | ICD-10-CM | POA: Diagnosis not present

## 2020-06-08 DIAGNOSIS — H6123 Impacted cerumen, bilateral: Secondary | ICD-10-CM | POA: Diagnosis not present

## 2020-06-12 DIAGNOSIS — E785 Hyperlipidemia, unspecified: Secondary | ICD-10-CM | POA: Diagnosis not present

## 2020-06-12 DIAGNOSIS — E119 Type 2 diabetes mellitus without complications: Secondary | ICD-10-CM | POA: Diagnosis not present

## 2020-06-12 DIAGNOSIS — I10 Essential (primary) hypertension: Secondary | ICD-10-CM | POA: Diagnosis not present

## 2020-06-15 DIAGNOSIS — I152 Hypertension secondary to endocrine disorders: Secondary | ICD-10-CM | POA: Diagnosis not present

## 2020-06-15 DIAGNOSIS — E1159 Type 2 diabetes mellitus with other circulatory complications: Secondary | ICD-10-CM | POA: Diagnosis not present

## 2020-06-15 DIAGNOSIS — E785 Hyperlipidemia, unspecified: Secondary | ICD-10-CM | POA: Diagnosis not present

## 2020-06-15 DIAGNOSIS — I10 Essential (primary) hypertension: Secondary | ICD-10-CM | POA: Diagnosis not present

## 2020-07-14 DIAGNOSIS — R112 Nausea with vomiting, unspecified: Secondary | ICD-10-CM | POA: Diagnosis not present

## 2020-07-14 DIAGNOSIS — R509 Fever, unspecified: Secondary | ICD-10-CM | POA: Diagnosis not present

## 2020-07-14 DIAGNOSIS — L0390 Cellulitis, unspecified: Secondary | ICD-10-CM | POA: Diagnosis not present

## 2020-07-14 DIAGNOSIS — R519 Headache, unspecified: Secondary | ICD-10-CM | POA: Diagnosis not present

## 2020-07-15 DIAGNOSIS — Z6829 Body mass index (BMI) 29.0-29.9, adult: Secondary | ICD-10-CM | POA: Diagnosis not present

## 2020-07-15 DIAGNOSIS — R509 Fever, unspecified: Secondary | ICD-10-CM | POA: Diagnosis not present

## 2020-07-17 DIAGNOSIS — Z4800 Encounter for change or removal of nonsurgical wound dressing: Secondary | ICD-10-CM | POA: Diagnosis not present

## 2020-07-17 DIAGNOSIS — Z20822 Contact with and (suspected) exposure to covid-19: Secondary | ICD-10-CM | POA: Diagnosis not present

## 2020-07-17 DIAGNOSIS — Z5189 Encounter for other specified aftercare: Secondary | ICD-10-CM | POA: Diagnosis not present

## 2020-08-27 DIAGNOSIS — H6990 Unspecified Eustachian tube disorder, unspecified ear: Secondary | ICD-10-CM | POA: Diagnosis not present

## 2020-08-27 DIAGNOSIS — Z6828 Body mass index (BMI) 28.0-28.9, adult: Secondary | ICD-10-CM | POA: Diagnosis not present

## 2020-08-27 DIAGNOSIS — D333 Benign neoplasm of cranial nerves: Secondary | ICD-10-CM | POA: Diagnosis not present

## 2020-09-02 ENCOUNTER — Other Ambulatory Visit: Payer: Self-pay | Admitting: Physician Assistant

## 2020-09-02 DIAGNOSIS — D333 Benign neoplasm of cranial nerves: Secondary | ICD-10-CM

## 2020-09-14 DIAGNOSIS — D333 Benign neoplasm of cranial nerves: Secondary | ICD-10-CM | POA: Diagnosis not present

## 2020-09-14 DIAGNOSIS — E1159 Type 2 diabetes mellitus with other circulatory complications: Secondary | ICD-10-CM | POA: Diagnosis not present

## 2020-09-14 DIAGNOSIS — Z6829 Body mass index (BMI) 29.0-29.9, adult: Secondary | ICD-10-CM | POA: Diagnosis not present

## 2020-09-14 DIAGNOSIS — I152 Hypertension secondary to endocrine disorders: Secondary | ICD-10-CM | POA: Diagnosis not present

## 2020-09-17 ENCOUNTER — Other Ambulatory Visit: Payer: Self-pay

## 2020-09-17 ENCOUNTER — Ambulatory Visit
Admission: RE | Admit: 2020-09-17 | Discharge: 2020-09-17 | Disposition: A | Discharge status: Home / Self Care | Payer: TRICARE For Life (TFL) | Source: Ambulatory Visit | Attending: Physician Assistant | Admitting: Physician Assistant

## 2020-09-17 ENCOUNTER — Other Ambulatory Visit: Payer: BC Managed Care – PPO

## 2020-09-17 DIAGNOSIS — H938X1 Other specified disorders of right ear: Secondary | ICD-10-CM | POA: Diagnosis not present

## 2020-09-17 DIAGNOSIS — D333 Benign neoplasm of cranial nerves: Secondary | ICD-10-CM

## 2020-09-17 DIAGNOSIS — I619 Nontraumatic intracerebral hemorrhage, unspecified: Secondary | ICD-10-CM | POA: Diagnosis not present

## 2020-09-17 DIAGNOSIS — R220 Localized swelling, mass and lump, head: Secondary | ICD-10-CM | POA: Diagnosis not present

## 2020-09-17 MED ORDER — GADOBENATE DIMEGLUMINE 529 MG/ML IV SOLN
20.0000 mL | Freq: Once | INTRAVENOUS | Status: AC | PRN
  Administered 2020-09-17: 20 mL via INTRAVENOUS

## 2020-10-01 DIAGNOSIS — I10 Essential (primary) hypertension: Secondary | ICD-10-CM | POA: Diagnosis not present

## 2020-10-01 DIAGNOSIS — Z6829 Body mass index (BMI) 29.0-29.9, adult: Secondary | ICD-10-CM | POA: Diagnosis not present

## 2020-10-01 DIAGNOSIS — D333 Benign neoplasm of cranial nerves: Secondary | ICD-10-CM | POA: Diagnosis not present

## 2020-10-14 DIAGNOSIS — E785 Hyperlipidemia, unspecified: Secondary | ICD-10-CM | POA: Diagnosis not present

## 2020-10-14 DIAGNOSIS — E119 Type 2 diabetes mellitus without complications: Secondary | ICD-10-CM | POA: Diagnosis not present

## 2020-10-20 DIAGNOSIS — H2513 Age-related nuclear cataract, bilateral: Secondary | ICD-10-CM | POA: Diagnosis not present

## 2020-10-20 DIAGNOSIS — E119 Type 2 diabetes mellitus without complications: Secondary | ICD-10-CM | POA: Diagnosis not present

## 2020-11-02 DIAGNOSIS — Z6829 Body mass index (BMI) 29.0-29.9, adult: Secondary | ICD-10-CM | POA: Diagnosis not present

## 2020-11-02 DIAGNOSIS — E1169 Type 2 diabetes mellitus with other specified complication: Secondary | ICD-10-CM | POA: Diagnosis not present

## 2020-11-02 DIAGNOSIS — E785 Hyperlipidemia, unspecified: Secondary | ICD-10-CM | POA: Diagnosis not present

## 2020-11-02 DIAGNOSIS — I10 Essential (primary) hypertension: Secondary | ICD-10-CM | POA: Diagnosis not present

## 2020-11-13 DIAGNOSIS — Z6829 Body mass index (BMI) 29.0-29.9, adult: Secondary | ICD-10-CM | POA: Diagnosis not present

## 2020-11-13 DIAGNOSIS — I10 Essential (primary) hypertension: Secondary | ICD-10-CM | POA: Diagnosis not present

## 2020-11-19 ENCOUNTER — Other Ambulatory Visit: Payer: Self-pay

## 2020-11-19 ENCOUNTER — Ambulatory Visit (INDEPENDENT_AMBULATORY_CARE_PROVIDER_SITE_OTHER): Payer: Medicare Other | Admitting: Otolaryngology

## 2020-11-19 DIAGNOSIS — H903 Sensorineural hearing loss, bilateral: Secondary | ICD-10-CM | POA: Diagnosis not present

## 2020-11-19 DIAGNOSIS — J310 Chronic rhinitis: Secondary | ICD-10-CM

## 2020-11-19 DIAGNOSIS — D333 Benign neoplasm of cranial nerves: Secondary | ICD-10-CM | POA: Diagnosis not present

## 2020-11-19 MED ORDER — TRIAMCINOLONE ACETONIDE 55 MCG/ACT NA AERO: 2.0000 | INHALATION_SPRAY | Freq: Every day | NASAL | 12 refills | Status: AC

## 2020-11-19 NOTE — Progress Notes (Signed)
HPI: Mark Howard is a 70 y.o. male who presents is [facility] for evaluation of ear pressure and sinus complaints.  He complains of pressure mostly in the right ear and also thick mucus in the back of his nose and throat.  He has had longstanding hearing loss much worse in the right ear secondary to a vestibular schwannoma.  He had an MRI scan performed 2 months ago that showed a 4 x 4 millimeter right vestibular schwannoma.  On review of this MRI scan the sinuses were clear.  Past Medical History:  Diagnosis Date   GERD (gastroesophageal reflux disease)    HOH (hard of hearing)    Hyperlipemia    Hypertension    Snores    Wears glasses    Wears hearing aid    both ears   Past Surgical History:  Procedure Laterality Date   COLONOSCOPY     KNEE ARTHROSCOPY  2009   right   TENDON TRANSFER Left 03/28/2013   Procedure: Left Long Finger Extensor Tendon Repair;  Surgeon: Tennis Must, MD;  Location: [facility];  Service: Orthopedics;  Laterality: Left;   TONSILLECTOMY     Social History   Socioeconomic History   Marital status: Married    Spouse name: Not on file   Number of children: Not on file   Years of education: Not on file   Highest education level: Not on file  Occupational History   Not on file  Tobacco Use   Smoking status: Former    Types: Cigarettes    Quit date: 03/19/1999    Years since quitting: 21.6   Smokeless tobacco: Not on file  Substance and Sexual Activity   Alcohol use: Yes    Comment: occ   Drug use: No   Sexual activity: Not on file  Other Topics Concern   Not on file  Social History Narrative   Not on file   Social Determinants of Health   Financial Resource Strain: Not on file  Food Insecurity: Not on file  Transportation Needs: Not on file  Physical Activity: Not on file  Stress: Not on file  Social Connections: Not on file   No family history on file. Allergies  Allergen Reactions   Shellfish  Allergy Anaphylaxis   Prior to Admission medications   Medication Sig Start Date End Date Taking? Authorizing Provider  acetaminophen-codeine (TYLENOL #3) 300-30 MG per tablet 1-2 tabs po q 6 hours prn pain 03/28/13   Leanora Cover, MD  atorvastatin (LIPITOR) 40 MG tablet Take 40 mg by mouth daily.    [provider]  enalapril-hydrochlorothiazide (VASERETIC) 10-25 MG per tablet Take 1 tablet by mouth daily.    [provider]  finasteride (PROSCAR) 5 MG tablet Take 5 mg by mouth daily.    [provider]     Positive ROS: Otherwise negative  All other systems have been reviewed and were otherwise negative with the exception of those mentioned in the HPI and as above.  Physical Exam: Constitutional: Alert, well-appearing, no acute distress Ears: External ears without lesions or tenderness. Ear canals are clear bilaterally with intact, clear TMs bilaterally.  On hearing screening with a tuning forks he has severe hearing loss in the right ear and moderate hearing loss in the left ear subjectively. Nasal: External nose without lesions. Septum with minimal deformity and mild rhinitis.  Both middle meatus regions are clear.  No mucopurulent discharge noted.  No polyps noted.  No clinical evidence of active sinus infection.  Oral: Lips and gums without lesions. Tongue and palate mucosa without lesions. Posterior oropharynx clear. Neck: No palpable adenopathy or masses Respiratory: Breathing comfortably  Skin: No facial/neck lesions or rash noted.  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  This is probably also contributing to the pressure sensation in the right ear.  He has moderate left ear sensorineural hearing loss. Chronic rhinitis with no signs of infection clinically or on review of MRI scan  Plan: Recommended use of Nasacort 2 sprays each nostril at night as well as saline irrigation to help with  postnasal drainage. He can also use antihistamines although he says that they do not seem to help. Sent a prescription into his pharmacy for Nasacort 2 sprays each nostril at night. Concerning his hearing loss the only treatment option of this would be hearing aids and reviewed this with him. Would recommend further follow-up of his vestibular schwannoma with [facility].   Radene Journey, MD   CC:

## 2020-12-24 DIAGNOSIS — Z23 Encounter for immunization: Secondary | ICD-10-CM | POA: Diagnosis not present

## 2021-02-11 DIAGNOSIS — Z139 Encounter for screening, unspecified: Secondary | ICD-10-CM | POA: Diagnosis not present

## 2021-02-11 DIAGNOSIS — Z0000 Encounter for general adult medical examination without abnormal findings: Secondary | ICD-10-CM | POA: Diagnosis not present

## 2021-02-11 DIAGNOSIS — Z1331 Encounter for screening for depression: Secondary | ICD-10-CM | POA: Diagnosis not present

## 2021-02-11 DIAGNOSIS — Z136 Encounter for screening for cardiovascular disorders: Secondary | ICD-10-CM | POA: Diagnosis not present

## 2021-02-11 DIAGNOSIS — Z1339 Encounter for screening examination for other mental health and behavioral disorders: Secondary | ICD-10-CM | POA: Diagnosis not present

## 2021-02-16 DIAGNOSIS — E1169 Type 2 diabetes mellitus with other specified complication: Secondary | ICD-10-CM | POA: Diagnosis not present

## 2021-02-16 DIAGNOSIS — Z125 Encounter for screening for malignant neoplasm of prostate: Secondary | ICD-10-CM | POA: Diagnosis not present

## 2021-02-19 DIAGNOSIS — R972 Elevated prostate specific antigen [PSA]: Secondary | ICD-10-CM | POA: Diagnosis not present

## 2021-02-19 DIAGNOSIS — E785 Hyperlipidemia, unspecified: Secondary | ICD-10-CM | POA: Diagnosis not present

## 2021-02-19 DIAGNOSIS — E11628 Type 2 diabetes mellitus with other skin complications: Secondary | ICD-10-CM | POA: Diagnosis not present

## 2021-02-19 DIAGNOSIS — I10 Essential (primary) hypertension: Secondary | ICD-10-CM | POA: Diagnosis not present

## 2021-04-28 DIAGNOSIS — Z20822 Contact with and (suspected) exposure to covid-19: Secondary | ICD-10-CM | POA: Diagnosis not present

## 2021-04-28 DIAGNOSIS — R059 Cough, unspecified: Secondary | ICD-10-CM | POA: Diagnosis not present

## 2021-04-28 DIAGNOSIS — U071 COVID-19: Secondary | ICD-10-CM | POA: Diagnosis not present

## 2021-04-28 DIAGNOSIS — J020 Streptococcal pharyngitis: Secondary | ICD-10-CM | POA: Diagnosis not present

## 2021-05-03 DIAGNOSIS — N401 Enlarged prostate with lower urinary tract symptoms: Secondary | ICD-10-CM | POA: Diagnosis not present

## 2021-05-03 DIAGNOSIS — N138 Other obstructive and reflux uropathy: Secondary | ICD-10-CM | POA: Diagnosis not present

## 2021-05-03 DIAGNOSIS — N529 Male erectile dysfunction, unspecified: Secondary | ICD-10-CM | POA: Diagnosis not present

## 2021-05-03 DIAGNOSIS — R972 Elevated prostate specific antigen [PSA]: Secondary | ICD-10-CM | POA: Diagnosis not present

## 2021-06-14 DIAGNOSIS — E1159 Type 2 diabetes mellitus with other circulatory complications: Secondary | ICD-10-CM | POA: Diagnosis not present

## 2021-06-14 DIAGNOSIS — E785 Hyperlipidemia, unspecified: Secondary | ICD-10-CM | POA: Diagnosis not present

## 2021-06-18 DIAGNOSIS — R070 Pain in throat: Secondary | ICD-10-CM | POA: Diagnosis not present

## 2021-06-18 DIAGNOSIS — R051 Acute cough: Secondary | ICD-10-CM | POA: Diagnosis not present

## 2021-06-21 DIAGNOSIS — I152 Hypertension secondary to endocrine disorders: Secondary | ICD-10-CM | POA: Diagnosis not present

## 2021-06-21 DIAGNOSIS — E1159 Type 2 diabetes mellitus with other circulatory complications: Secondary | ICD-10-CM | POA: Diagnosis not present

## 2021-06-21 DIAGNOSIS — E785 Hyperlipidemia, unspecified: Secondary | ICD-10-CM | POA: Diagnosis not present

## 2021-06-21 DIAGNOSIS — I10 Essential (primary) hypertension: Secondary | ICD-10-CM | POA: Diagnosis not present

## 2021-07-27 DIAGNOSIS — I152 Hypertension secondary to endocrine disorders: Secondary | ICD-10-CM | POA: Diagnosis not present

## 2021-07-27 DIAGNOSIS — E1159 Type 2 diabetes mellitus with other circulatory complications: Secondary | ICD-10-CM | POA: Diagnosis not present

## 2021-07-27 DIAGNOSIS — R197 Diarrhea, unspecified: Secondary | ICD-10-CM | POA: Diagnosis not present

## 2021-07-27 DIAGNOSIS — R1011 Right upper quadrant pain: Secondary | ICD-10-CM | POA: Diagnosis not present

## 2021-07-29 DIAGNOSIS — R932 Abnormal findings on diagnostic imaging of liver and biliary tract: Secondary | ICD-10-CM | POA: Diagnosis not present

## 2021-07-29 DIAGNOSIS — R1011 Right upper quadrant pain: Secondary | ICD-10-CM | POA: Diagnosis not present

## 2021-07-29 DIAGNOSIS — R1031 Right lower quadrant pain: Secondary | ICD-10-CM | POA: Diagnosis not present

## 2021-10-14 DIAGNOSIS — E11628 Type 2 diabetes mellitus with other skin complications: Secondary | ICD-10-CM | POA: Diagnosis not present

## 2021-10-14 DIAGNOSIS — E785 Hyperlipidemia, unspecified: Secondary | ICD-10-CM | POA: Diagnosis not present

## 2021-10-21 DIAGNOSIS — H2513 Age-related nuclear cataract, bilateral: Secondary | ICD-10-CM | POA: Diagnosis not present

## 2021-10-21 DIAGNOSIS — E119 Type 2 diabetes mellitus without complications: Secondary | ICD-10-CM | POA: Diagnosis not present

## 2021-10-22 DIAGNOSIS — E785 Hyperlipidemia, unspecified: Secondary | ICD-10-CM | POA: Diagnosis not present

## 2021-10-22 DIAGNOSIS — E11628 Type 2 diabetes mellitus with other skin complications: Secondary | ICD-10-CM | POA: Diagnosis not present

## 2021-10-22 DIAGNOSIS — I10 Essential (primary) hypertension: Secondary | ICD-10-CM | POA: Diagnosis not present

## 2021-10-22 DIAGNOSIS — Z6830 Body mass index (BMI) 30.0-30.9, adult: Secondary | ICD-10-CM | POA: Diagnosis not present

## 2021-11-16 DIAGNOSIS — Z6829 Body mass index (BMI) 29.0-29.9, adult: Secondary | ICD-10-CM | POA: Diagnosis not present

## 2021-11-16 DIAGNOSIS — K2970 Gastritis, unspecified, without bleeding: Secondary | ICD-10-CM | POA: Diagnosis not present

## 2021-11-16 DIAGNOSIS — R197 Diarrhea, unspecified: Secondary | ICD-10-CM | POA: Diagnosis not present

## 2021-11-23 DIAGNOSIS — I700 Atherosclerosis of aorta: Secondary | ICD-10-CM | POA: Diagnosis not present

## 2021-11-23 DIAGNOSIS — R1032 Left lower quadrant pain: Secondary | ICD-10-CM | POA: Diagnosis not present

## 2021-11-23 DIAGNOSIS — R9431 Abnormal electrocardiogram [ECG] [EKG]: Secondary | ICD-10-CM | POA: Diagnosis not present

## 2021-11-30 DIAGNOSIS — Z6830 Body mass index (BMI) 30.0-30.9, adult: Secondary | ICD-10-CM | POA: Diagnosis not present

## 2021-11-30 DIAGNOSIS — R140 Abdominal distension (gaseous): Secondary | ICD-10-CM | POA: Diagnosis not present

## 2021-12-01 DIAGNOSIS — R140 Abdominal distension (gaseous): Secondary | ICD-10-CM | POA: Diagnosis not present

## 2021-12-23 DIAGNOSIS — Z23 Encounter for immunization: Secondary | ICD-10-CM | POA: Diagnosis not present

## 2022-02-17 DIAGNOSIS — E1159 Type 2 diabetes mellitus with other circulatory complications: Secondary | ICD-10-CM | POA: Diagnosis not present

## 2022-02-17 DIAGNOSIS — E785 Hyperlipidemia, unspecified: Secondary | ICD-10-CM | POA: Diagnosis not present

## 2022-02-17 DIAGNOSIS — Z125 Encounter for screening for malignant neoplasm of prostate: Secondary | ICD-10-CM | POA: Diagnosis not present

## 2022-02-17 DIAGNOSIS — E11628 Type 2 diabetes mellitus with other skin complications: Secondary | ICD-10-CM | POA: Diagnosis not present

## 2022-02-21 DIAGNOSIS — E785 Hyperlipidemia, unspecified: Secondary | ICD-10-CM | POA: Diagnosis not present

## 2022-02-21 DIAGNOSIS — I10 Essential (primary) hypertension: Secondary | ICD-10-CM | POA: Diagnosis not present

## 2022-02-21 DIAGNOSIS — B354 Tinea corporis: Secondary | ICD-10-CM | POA: Diagnosis not present

## 2022-02-21 DIAGNOSIS — E1129 Type 2 diabetes mellitus with other diabetic kidney complication: Secondary | ICD-10-CM | POA: Diagnosis not present

## 2022-02-21 DIAGNOSIS — Z6830 Body mass index (BMI) 30.0-30.9, adult: Secondary | ICD-10-CM | POA: Diagnosis not present

## 2022-04-20 DIAGNOSIS — Z136 Encounter for screening for cardiovascular disorders: Secondary | ICD-10-CM | POA: Diagnosis not present

## 2022-04-20 DIAGNOSIS — Z1331 Encounter for screening for depression: Secondary | ICD-10-CM | POA: Diagnosis not present

## 2022-04-20 DIAGNOSIS — Z139 Encounter for screening, unspecified: Secondary | ICD-10-CM | POA: Diagnosis not present

## 2022-04-20 DIAGNOSIS — Z0000 Encounter for general adult medical examination without abnormal findings: Secondary | ICD-10-CM | POA: Diagnosis not present

## 2022-04-20 DIAGNOSIS — Z1339 Encounter for screening examination for other mental health and behavioral disorders: Secondary | ICD-10-CM | POA: Diagnosis not present

## 2022-04-20 DIAGNOSIS — Z0189 Encounter for other specified special examinations: Secondary | ICD-10-CM | POA: Diagnosis not present

## 2022-04-29 DIAGNOSIS — S76312A Strain of muscle, fascia and tendon of the posterior muscle group at thigh level, left thigh, initial encounter: Secondary | ICD-10-CM | POA: Diagnosis not present

## 2022-04-29 DIAGNOSIS — I10 Essential (primary) hypertension: Secondary | ICD-10-CM | POA: Diagnosis not present

## 2022-04-29 DIAGNOSIS — Z6830 Body mass index (BMI) 30.0-30.9, adult: Secondary | ICD-10-CM | POA: Diagnosis not present

## 2022-06-13 DIAGNOSIS — M79605 Pain in left leg: Secondary | ICD-10-CM | POA: Diagnosis not present

## 2022-06-13 DIAGNOSIS — Z6830 Body mass index (BMI) 30.0-30.9, adult: Secondary | ICD-10-CM | POA: Diagnosis not present

## 2022-06-20 DIAGNOSIS — I152 Hypertension secondary to endocrine disorders: Secondary | ICD-10-CM | POA: Diagnosis not present

## 2022-06-20 DIAGNOSIS — E1159 Type 2 diabetes mellitus with other circulatory complications: Secondary | ICD-10-CM | POA: Diagnosis not present

## 2022-06-20 DIAGNOSIS — I700 Atherosclerosis of aorta: Secondary | ICD-10-CM | POA: Diagnosis not present

## 2022-06-20 DIAGNOSIS — Z6829 Body mass index (BMI) 29.0-29.9, adult: Secondary | ICD-10-CM | POA: Diagnosis not present

## 2022-06-20 DIAGNOSIS — Z789 Other specified health status: Secondary | ICD-10-CM | POA: Diagnosis not present

## 2022-06-28 DIAGNOSIS — E785 Hyperlipidemia, unspecified: Secondary | ICD-10-CM | POA: Diagnosis not present

## 2022-06-28 DIAGNOSIS — E1129 Type 2 diabetes mellitus with other diabetic kidney complication: Secondary | ICD-10-CM | POA: Diagnosis not present

## 2022-06-28 DIAGNOSIS — E1159 Type 2 diabetes mellitus with other circulatory complications: Secondary | ICD-10-CM | POA: Diagnosis not present

## 2022-07-04 DIAGNOSIS — Z6829 Body mass index (BMI) 29.0-29.9, adult: Secondary | ICD-10-CM | POA: Diagnosis not present

## 2022-07-04 DIAGNOSIS — I152 Hypertension secondary to endocrine disorders: Secondary | ICD-10-CM | POA: Diagnosis not present

## 2022-07-04 DIAGNOSIS — E785 Hyperlipidemia, unspecified: Secondary | ICD-10-CM | POA: Diagnosis not present

## 2022-07-04 DIAGNOSIS — E1159 Type 2 diabetes mellitus with other circulatory complications: Secondary | ICD-10-CM | POA: Diagnosis not present

## 2022-07-04 DIAGNOSIS — I10 Essential (primary) hypertension: Secondary | ICD-10-CM | POA: Diagnosis not present

## 2022-07-06 DIAGNOSIS — E11628 Type 2 diabetes mellitus with other skin complications: Secondary | ICD-10-CM | POA: Diagnosis not present

## 2022-07-06 DIAGNOSIS — I10 Essential (primary) hypertension: Secondary | ICD-10-CM | POA: Diagnosis not present

## 2022-07-18 DIAGNOSIS — S76312A Strain of muscle, fascia and tendon of the posterior muscle group at thigh level, left thigh, initial encounter: Secondary | ICD-10-CM | POA: Diagnosis not present

## 2022-07-18 DIAGNOSIS — M5416 Radiculopathy, lumbar region: Secondary | ICD-10-CM | POA: Diagnosis not present

## 2022-07-29 DIAGNOSIS — M5416 Radiculopathy, lumbar region: Secondary | ICD-10-CM | POA: Diagnosis not present

## 2022-08-05 DIAGNOSIS — Z6830 Body mass index (BMI) 30.0-30.9, adult: Secondary | ICD-10-CM | POA: Diagnosis not present

## 2022-08-05 DIAGNOSIS — M5416 Radiculopathy, lumbar region: Secondary | ICD-10-CM | POA: Diagnosis not present

## 2022-08-05 DIAGNOSIS — E11628 Type 2 diabetes mellitus with other skin complications: Secondary | ICD-10-CM | POA: Diagnosis not present

## 2022-08-05 DIAGNOSIS — I10 Essential (primary) hypertension: Secondary | ICD-10-CM | POA: Diagnosis not present

## 2022-08-06 DIAGNOSIS — I10 Essential (primary) hypertension: Secondary | ICD-10-CM | POA: Diagnosis not present

## 2022-08-06 DIAGNOSIS — E11628 Type 2 diabetes mellitus with other skin complications: Secondary | ICD-10-CM | POA: Diagnosis not present

## 2022-08-31 DIAGNOSIS — M25472 Effusion, left ankle: Secondary | ICD-10-CM | POA: Diagnosis not present

## 2022-08-31 DIAGNOSIS — Z6830 Body mass index (BMI) 30.0-30.9, adult: Secondary | ICD-10-CM | POA: Diagnosis not present

## 2022-09-04 DIAGNOSIS — E11628 Type 2 diabetes mellitus with other skin complications: Secondary | ICD-10-CM | POA: Diagnosis not present

## 2022-09-04 DIAGNOSIS — I10 Essential (primary) hypertension: Secondary | ICD-10-CM | POA: Diagnosis not present

## 2022-09-05 DIAGNOSIS — I10 Essential (primary) hypertension: Secondary | ICD-10-CM | POA: Diagnosis not present

## 2022-09-05 DIAGNOSIS — E11628 Type 2 diabetes mellitus with other skin complications: Secondary | ICD-10-CM | POA: Diagnosis not present

## 2022-09-15 DIAGNOSIS — S76312A Strain of muscle, fascia and tendon of the posterior muscle group at thigh level, left thigh, initial encounter: Secondary | ICD-10-CM | POA: Diagnosis not present

## 2022-09-15 DIAGNOSIS — R600 Localized edema: Secondary | ICD-10-CM | POA: Diagnosis not present

## 2022-09-15 DIAGNOSIS — M5416 Radiculopathy, lumbar region: Secondary | ICD-10-CM | POA: Diagnosis not present

## 2022-09-15 DIAGNOSIS — M7989 Other specified soft tissue disorders: Secondary | ICD-10-CM | POA: Diagnosis not present

## 2022-09-27 DIAGNOSIS — M545 Low back pain, unspecified: Secondary | ICD-10-CM | POA: Diagnosis not present

## 2022-09-27 DIAGNOSIS — M47817 Spondylosis without myelopathy or radiculopathy, lumbosacral region: Secondary | ICD-10-CM | POA: Diagnosis not present

## 2022-10-05 DIAGNOSIS — M7989 Other specified soft tissue disorders: Secondary | ICD-10-CM | POA: Diagnosis not present

## 2022-10-05 DIAGNOSIS — S76312A Strain of muscle, fascia and tendon of the posterior muscle group at thigh level, left thigh, initial encounter: Secondary | ICD-10-CM | POA: Diagnosis not present

## 2022-10-12 DIAGNOSIS — R972 Elevated prostate specific antigen [PSA]: Secondary | ICD-10-CM | POA: Diagnosis not present

## 2022-10-13 DIAGNOSIS — M722 Plantar fascial fibromatosis: Secondary | ICD-10-CM | POA: Diagnosis not present

## 2022-10-13 DIAGNOSIS — M25572 Pain in left ankle and joints of left foot: Secondary | ICD-10-CM | POA: Diagnosis not present

## 2022-10-13 DIAGNOSIS — M7732 Calcaneal spur, left foot: Secondary | ICD-10-CM | POA: Diagnosis not present

## 2022-10-13 DIAGNOSIS — M25472 Effusion, left ankle: Secondary | ICD-10-CM | POA: Diagnosis not present

## 2022-10-19 DIAGNOSIS — R972 Elevated prostate specific antigen [PSA]: Secondary | ICD-10-CM | POA: Diagnosis not present

## 2022-10-31 DIAGNOSIS — H2513 Age-related nuclear cataract, bilateral: Secondary | ICD-10-CM | POA: Diagnosis not present

## 2022-10-31 DIAGNOSIS — E119 Type 2 diabetes mellitus without complications: Secondary | ICD-10-CM | POA: Diagnosis not present

## 2022-11-02 DIAGNOSIS — E1159 Type 2 diabetes mellitus with other circulatory complications: Secondary | ICD-10-CM | POA: Diagnosis not present

## 2022-11-02 DIAGNOSIS — E1129 Type 2 diabetes mellitus with other diabetic kidney complication: Secondary | ICD-10-CM | POA: Diagnosis not present

## 2022-11-02 DIAGNOSIS — E785 Hyperlipidemia, unspecified: Secondary | ICD-10-CM | POA: Diagnosis not present

## 2022-11-06 DIAGNOSIS — I10 Essential (primary) hypertension: Secondary | ICD-10-CM | POA: Diagnosis not present

## 2022-11-06 DIAGNOSIS — E11628 Type 2 diabetes mellitus with other skin complications: Secondary | ICD-10-CM | POA: Diagnosis not present

## 2022-11-11 DIAGNOSIS — Z6830 Body mass index (BMI) 30.0-30.9, adult: Secondary | ICD-10-CM | POA: Diagnosis not present

## 2022-11-11 DIAGNOSIS — E785 Hyperlipidemia, unspecified: Secondary | ICD-10-CM | POA: Diagnosis not present

## 2022-11-11 DIAGNOSIS — I10 Essential (primary) hypertension: Secondary | ICD-10-CM | POA: Diagnosis not present

## 2022-11-11 DIAGNOSIS — I152 Hypertension secondary to endocrine disorders: Secondary | ICD-10-CM | POA: Diagnosis not present

## 2022-11-11 DIAGNOSIS — Z23 Encounter for immunization: Secondary | ICD-10-CM | POA: Diagnosis not present

## 2022-11-11 DIAGNOSIS — E1159 Type 2 diabetes mellitus with other circulatory complications: Secondary | ICD-10-CM | POA: Diagnosis not present

## 2023-02-16 IMAGING — MR MR HEAD WO/W CM
12 of 13 series · 40 of 48 positions shown · IV contrast (multihance)
Comparison: None.

CLINICAL DATA: Right-sided ear issues. History vestibular
schwannoma.

EXAM:
MRI HEAD WITHOUT AND WITH CONTRAST
TECHNIQUE: Multiplanar, multiecho pulse sequences of the brain and surrounding
structures were obtained without and with intravenous contrast.
CONTRAST:  20mL MULTIHANCE GADOBENATE DIMEGLUMINE 529 MG/ML IV SOLN

[Series 2: T1 · sagittal · 5.0mm · 0.45mm/px · 3 of 25 slices shown (1 of 4)]
[im 1/25]
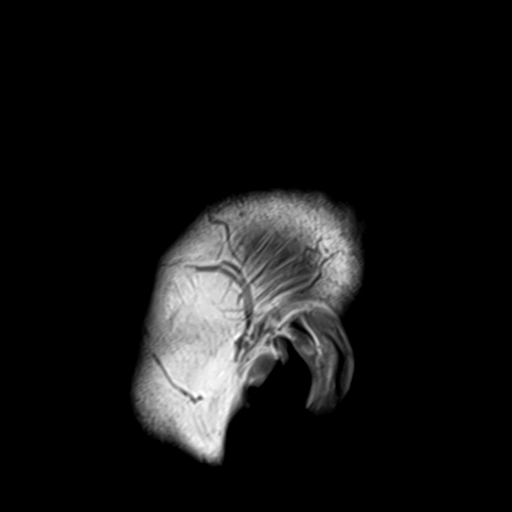
[im 13/25]
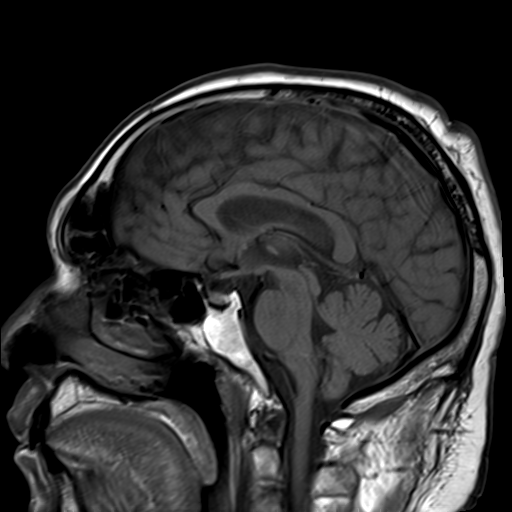
[im 25/25]
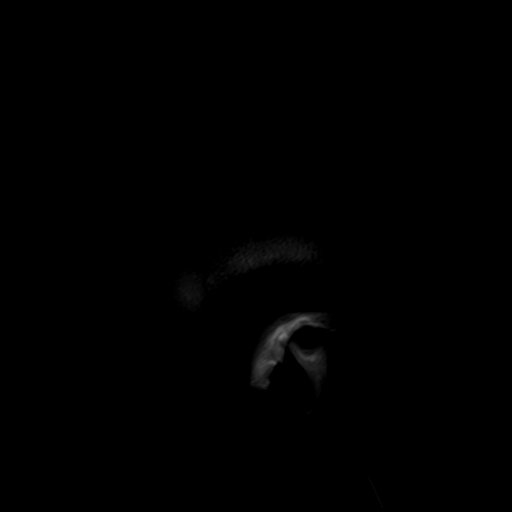

[Series 3: ax ep2d_diff_3 · axial · 3.0mm · 1.80mm/px · z∈[-96,+65]mm · 8 of 109 slices shown]
[im 1/109]
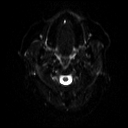
[im 13/109]
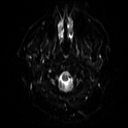
[im 37/109]
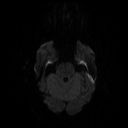
[im 49/109]
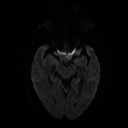
[im 61/109]
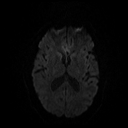
[im 73/109]
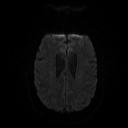
[im 97/109]
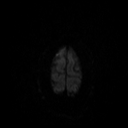
[im 109/109]
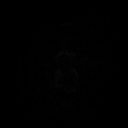

[Series 4: ax ep2d_diff_3_adc · axial · 3.0mm · 1.80mm/px · z∈[-96,+65]mm · 4 of 55 slices shown]
[im 1/55]
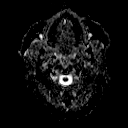
[im 19/55]
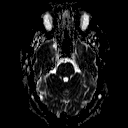
[im 37/55]
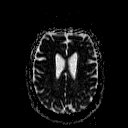
[im 55/55]
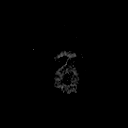

[Series 5: cor ep2d_diff · coronal · 5.0mm · 1.77mm/px · 5 of 59 slices shown]
[im 1/59]
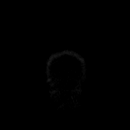
[im 15/59]
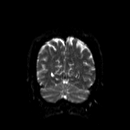
[im 30/59]
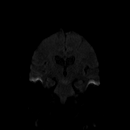
[im 44/59]
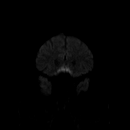
[im 59/59]
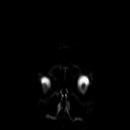

[Series 6: cor ep2d_diff_adc · coronal · 5.0mm · 1.77mm/px · 2 of 30 slices shown]
[im 1/30]
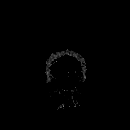
[im 30/30]
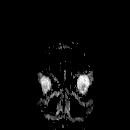

[Series 8: swi_images · axial · 2.0mm · 0.98mm/px · z∈[-92,+65]mm · 6 of 80 slices shown]
[im 1/80]
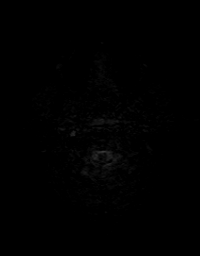
[im 16/80]
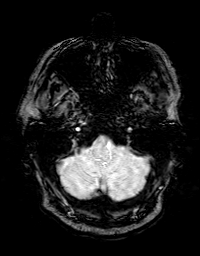
[im 32/80]
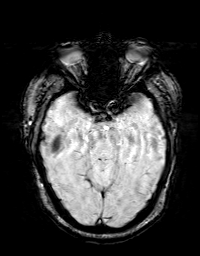
[im 48/80]
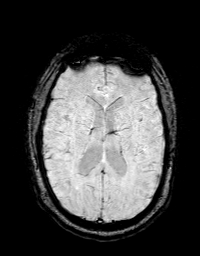
[im 64/80]
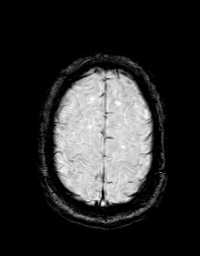
[im 80/80]
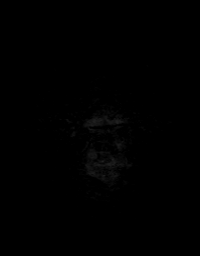

[Series 9: FLAIR · axial · 3.0mm · 0.43mm/px · z∈[-91,+60]mm · 3 of 40 slices shown]
[im 1/40]
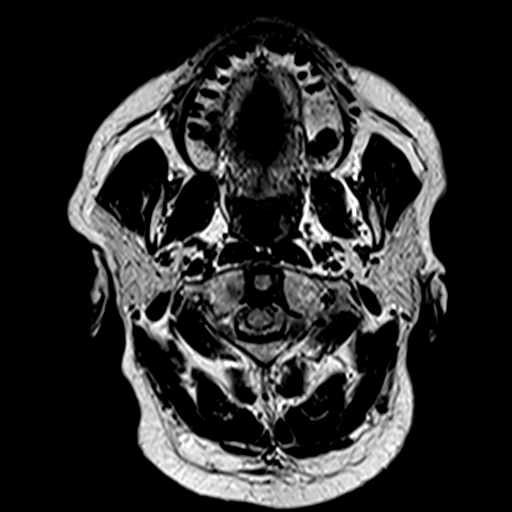
[im 20/40]
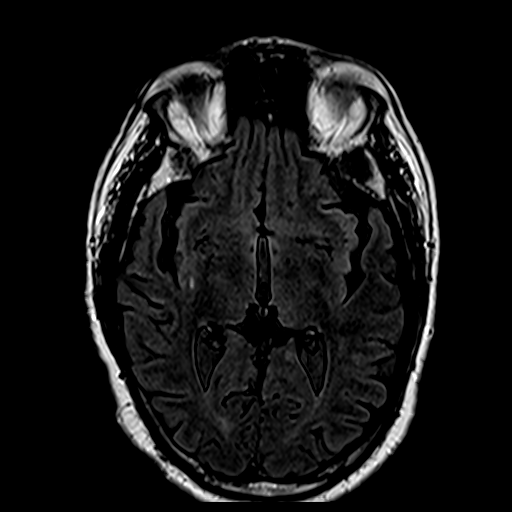
[im 40/40]
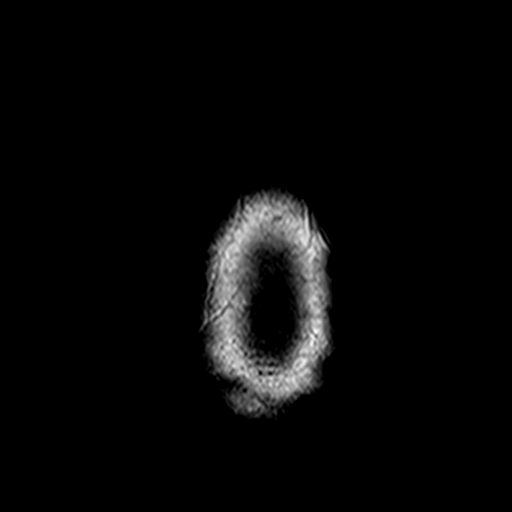

[Series 10: T1 · coronal · 3.0mm · 0.35mm/px · 1 of 14 slices shown (2 of 4)]
[im 1/14]
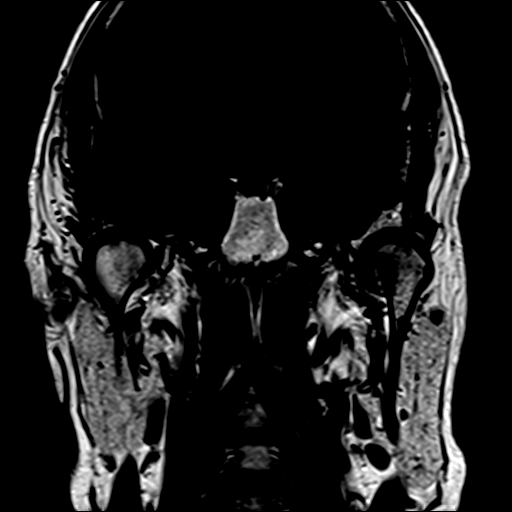

[Series 11: T1 · axial · 3.0mm · 0.35mm/px · 1 of 17 slices shown (3 of 4)]
[im 1/17]
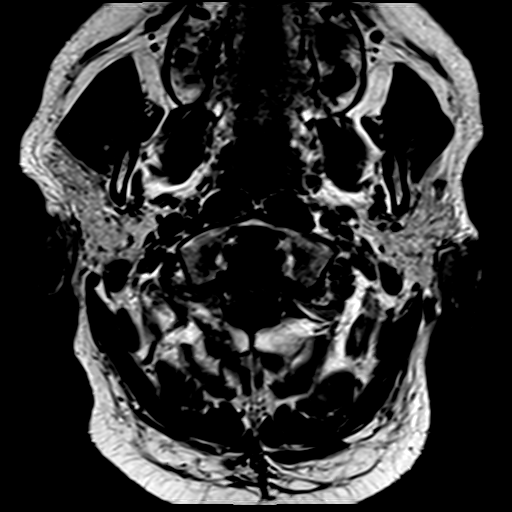

[Series 12: bSSFP · axial · 0.7mm · 0.28mm/px · z∈[-86,-42]mm · 5 of 64 slices shown]
[im 1/64]
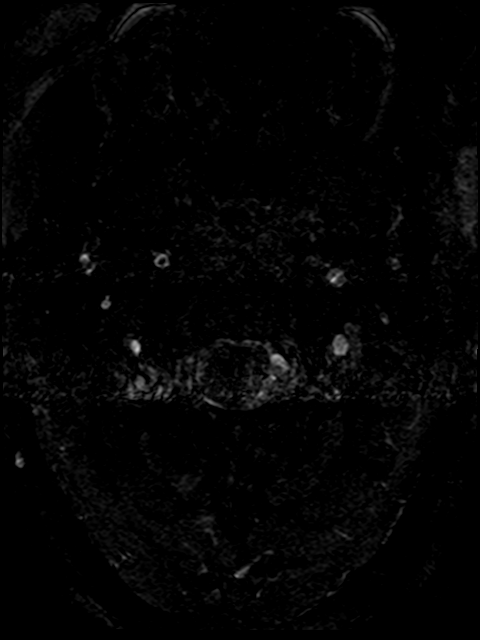
[im 16/64]
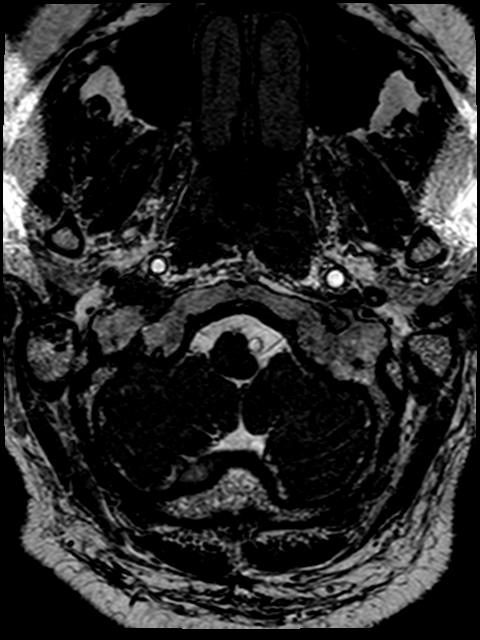
[im 32/64]
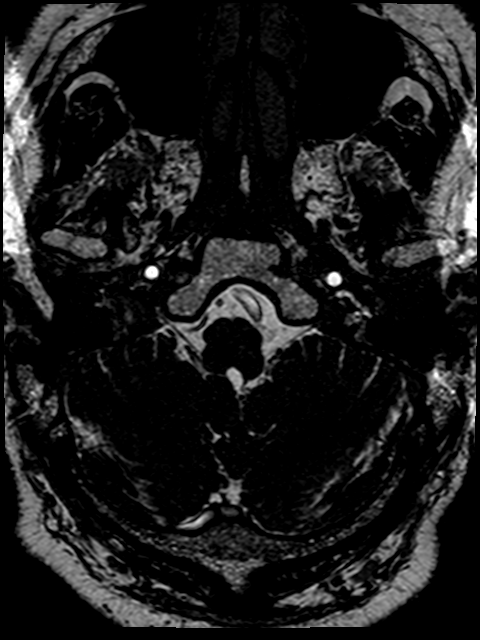
[im 48/64]
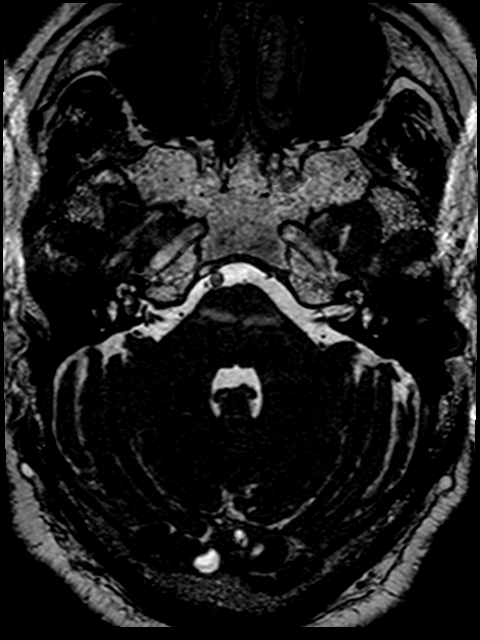
[im 64/64]
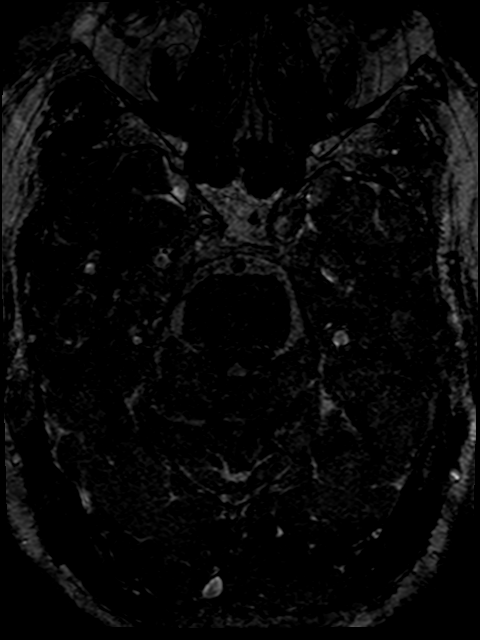

[Series 13: T1 · coronal · 3.0mm · 0.35mm/px · 1 of 14 slices shown (4 of 4)]
[im 1/14]
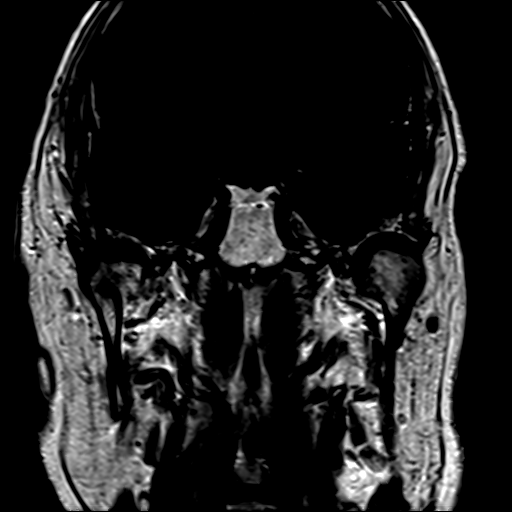

[Series 14: T1 post-contrast · axial · 3.0mm · 0.35mm/px · 1 of 17 slices shown]
[im 1/17]
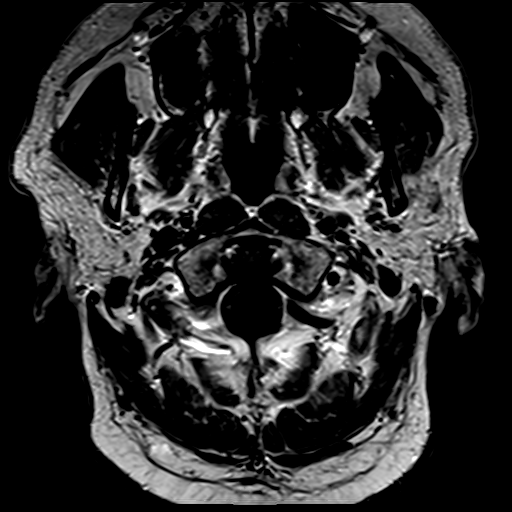

[40 of 48 positions shown; findings below may reference images not displayed]

FINDINGS: Brain: No acute infarct, mass effect or extra-axial collection. No
acute or chronic hemorrhage. There is multifocal hyperintense
T2-weighted signal within the white matter. Generalized volume loss
without a clear lobar predilection. The midline structures are
normal.

INTERNAL AUDITORY CANALS: There is a small contrast enhancing mass
within the right internal auditory canal that measures 4 x 2 x 4 mm.
The left internal auditory canal is normal. Focally a and semi
circular canals are normal. No vestibular aqueduct enlargement.
Normal locations of the internal carotid arteries and jugular venous
bulbs.

Vascular: Major flow voids are preserved.

Skull and upper cervical spine: Normal calvarium and skull base.
Visualized upper cervical spine and soft tissues are normal.

Sinuses/Orbits:No paranasal sinus fluid levels or advanced mucosal
thickening. No mastoid or middle ear effusion. Normal orbits.
IMPRESSION: 1. 4 x 2 x 4 mm contrast enhancing mass within the right internal
auditory canal, consistent with vestibular schwannoma.
2. Mild chronic microvascular disease and volume loss.

## 2023-05-04 DIAGNOSIS — Z0000 Encounter for general adult medical examination without abnormal findings: Secondary | ICD-10-CM | POA: Diagnosis not present

## 2023-05-04 DIAGNOSIS — Z1331 Encounter for screening for depression: Secondary | ICD-10-CM | POA: Diagnosis not present

## 2023-05-04 DIAGNOSIS — Z136 Encounter for screening for cardiovascular disorders: Secondary | ICD-10-CM | POA: Diagnosis not present

## 2023-05-04 DIAGNOSIS — Z6830 Body mass index (BMI) 30.0-30.9, adult: Secondary | ICD-10-CM | POA: Diagnosis not present

## 2023-05-04 DIAGNOSIS — Z1339 Encounter for screening examination for other mental health and behavioral disorders: Secondary | ICD-10-CM | POA: Diagnosis not present

## 2023-05-04 DIAGNOSIS — Z139 Encounter for screening, unspecified: Secondary | ICD-10-CM | POA: Diagnosis not present

## 2023-05-04 DIAGNOSIS — E669 Obesity, unspecified: Secondary | ICD-10-CM | POA: Diagnosis not present

## 2023-05-09 ENCOUNTER — Other Ambulatory Visit (HOSPITAL_BASED_OUTPATIENT_CLINIC_OR_DEPARTMENT_OTHER): Payer: Self-pay | Admitting: Family Medicine

## 2023-05-09 ENCOUNTER — Ambulatory Visit (INDEPENDENT_AMBULATORY_CARE_PROVIDER_SITE_OTHER)
Admission: RE | Admit: 2023-05-09 | Discharge: 2023-05-09 | Disposition: A | Discharge status: Home / Self Care | Source: Ambulatory Visit | Attending: Family Medicine | Admitting: Family Medicine

## 2023-05-09 ENCOUNTER — Encounter (HOSPITAL_BASED_OUTPATIENT_CLINIC_OR_DEPARTMENT_OTHER): Payer: Self-pay | Admitting: Family Medicine

## 2023-05-09 DIAGNOSIS — M7122 Synovial cyst of popliteal space [Baker], left knee: Secondary | ICD-10-CM

## 2023-05-09 DIAGNOSIS — M25562 Pain in left knee: Secondary | ICD-10-CM

## 2023-05-09 DIAGNOSIS — Z6830 Body mass index (BMI) 30.0-30.9, adult: Secondary | ICD-10-CM | POA: Diagnosis not present

## 2023-05-09 DIAGNOSIS — M1712 Unilateral primary osteoarthritis, left knee: Secondary | ICD-10-CM | POA: Diagnosis not present

## 2023-05-12 DIAGNOSIS — E785 Hyperlipidemia, unspecified: Secondary | ICD-10-CM | POA: Diagnosis not present

## 2023-05-12 DIAGNOSIS — E1159 Type 2 diabetes mellitus with other circulatory complications: Secondary | ICD-10-CM | POA: Diagnosis not present

## 2023-05-12 DIAGNOSIS — E1129 Type 2 diabetes mellitus with other diabetic kidney complication: Secondary | ICD-10-CM | POA: Diagnosis not present

## 2023-05-19 DIAGNOSIS — E1129 Type 2 diabetes mellitus with other diabetic kidney complication: Secondary | ICD-10-CM | POA: Diagnosis not present

## 2023-05-19 DIAGNOSIS — E785 Hyperlipidemia, unspecified: Secondary | ICD-10-CM | POA: Diagnosis not present

## 2023-05-19 DIAGNOSIS — I152 Hypertension secondary to endocrine disorders: Secondary | ICD-10-CM | POA: Diagnosis not present

## 2023-05-19 DIAGNOSIS — E1159 Type 2 diabetes mellitus with other circulatory complications: Secondary | ICD-10-CM | POA: Diagnosis not present

## 2023-05-19 DIAGNOSIS — Z6830 Body mass index (BMI) 30.0-30.9, adult: Secondary | ICD-10-CM | POA: Diagnosis not present

## 2023-06-15 DIAGNOSIS — E1129 Type 2 diabetes mellitus with other diabetic kidney complication: Secondary | ICD-10-CM | POA: Diagnosis not present

## 2023-06-15 DIAGNOSIS — E1159 Type 2 diabetes mellitus with other circulatory complications: Secondary | ICD-10-CM | POA: Diagnosis not present

## 2023-06-15 DIAGNOSIS — Z7189 Other specified counseling: Secondary | ICD-10-CM | POA: Diagnosis not present

## 2023-07-12 DIAGNOSIS — E1159 Type 2 diabetes mellitus with other circulatory complications: Secondary | ICD-10-CM | POA: Diagnosis not present

## 2023-07-12 DIAGNOSIS — E785 Hyperlipidemia, unspecified: Secondary | ICD-10-CM | POA: Diagnosis not present

## 2023-07-12 DIAGNOSIS — I10 Essential (primary) hypertension: Secondary | ICD-10-CM | POA: Diagnosis not present

## 2023-07-27 DIAGNOSIS — E1159 Type 2 diabetes mellitus with other circulatory complications: Secondary | ICD-10-CM | POA: Diagnosis not present

## 2023-07-27 DIAGNOSIS — E669 Obesity, unspecified: Secondary | ICD-10-CM | POA: Diagnosis not present

## 2023-07-27 DIAGNOSIS — I10 Essential (primary) hypertension: Secondary | ICD-10-CM | POA: Diagnosis not present

## 2023-07-27 DIAGNOSIS — E785 Hyperlipidemia, unspecified: Secondary | ICD-10-CM | POA: Diagnosis not present

## 2023-09-13 DIAGNOSIS — E1159 Type 2 diabetes mellitus with other circulatory complications: Secondary | ICD-10-CM | POA: Diagnosis not present

## 2023-09-13 DIAGNOSIS — E785 Hyperlipidemia, unspecified: Secondary | ICD-10-CM | POA: Diagnosis not present

## 2023-09-13 DIAGNOSIS — E1129 Type 2 diabetes mellitus with other diabetic kidney complication: Secondary | ICD-10-CM | POA: Diagnosis not present

## 2023-09-18 DIAGNOSIS — M25561 Pain in right knee: Secondary | ICD-10-CM | POA: Diagnosis not present

## 2023-09-18 DIAGNOSIS — M1711 Unilateral primary osteoarthritis, right knee: Secondary | ICD-10-CM | POA: Diagnosis not present

## 2023-09-18 DIAGNOSIS — Z6829 Body mass index (BMI) 29.0-29.9, adult: Secondary | ICD-10-CM | POA: Diagnosis not present

## 2023-09-18 DIAGNOSIS — Z1331 Encounter for screening for depression: Secondary | ICD-10-CM | POA: Diagnosis not present

## 2023-09-18 DIAGNOSIS — E785 Hyperlipidemia, unspecified: Secondary | ICD-10-CM | POA: Diagnosis not present

## 2023-09-18 DIAGNOSIS — E1159 Type 2 diabetes mellitus with other circulatory complications: Secondary | ICD-10-CM | POA: Diagnosis not present

## 2023-09-18 DIAGNOSIS — E1129 Type 2 diabetes mellitus with other diabetic kidney complication: Secondary | ICD-10-CM | POA: Diagnosis not present

## 2023-09-18 DIAGNOSIS — I152 Hypertension secondary to endocrine disorders: Secondary | ICD-10-CM | POA: Diagnosis not present

## 2023-10-13 ENCOUNTER — Other Ambulatory Visit (HOSPITAL_BASED_OUTPATIENT_CLINIC_OR_DEPARTMENT_OTHER): Payer: Self-pay | Admitting: Family Medicine

## 2023-10-13 DIAGNOSIS — G8929 Other chronic pain: Secondary | ICD-10-CM

## 2023-10-13 DIAGNOSIS — Z6829 Body mass index (BMI) 29.0-29.9, adult: Secondary | ICD-10-CM | POA: Diagnosis not present

## 2023-10-13 DIAGNOSIS — M1711 Unilateral primary osteoarthritis, right knee: Secondary | ICD-10-CM

## 2023-10-13 DIAGNOSIS — M25561 Pain in right knee: Secondary | ICD-10-CM | POA: Diagnosis not present

## 2023-11-01 DIAGNOSIS — H2513 Age-related nuclear cataract, bilateral: Secondary | ICD-10-CM | POA: Diagnosis not present

## 2023-11-01 DIAGNOSIS — E119 Type 2 diabetes mellitus without complications: Secondary | ICD-10-CM | POA: Diagnosis not present

## 2023-11-02 ENCOUNTER — Ambulatory Visit (INDEPENDENT_AMBULATORY_CARE_PROVIDER_SITE_OTHER)
Admission: RE | Admit: 2023-11-02 | Discharge: 2023-11-02 | Disposition: A | Discharge status: Home / Self Care | Source: Ambulatory Visit | Attending: Family Medicine | Admitting: Family Medicine

## 2023-11-02 DIAGNOSIS — M25561 Pain in right knee: Secondary | ICD-10-CM

## 2023-11-02 DIAGNOSIS — G8929 Other chronic pain: Secondary | ICD-10-CM

## 2023-11-02 DIAGNOSIS — M1711 Unilateral primary osteoarthritis, right knee: Secondary | ICD-10-CM

## 2023-11-15 DIAGNOSIS — S86919A Strain of unspecified muscle(s) and tendon(s) at lower leg level, unspecified leg, initial encounter: Secondary | ICD-10-CM | POA: Diagnosis not present

## 2023-11-15 DIAGNOSIS — M23209 Derangement of unspecified meniscus due to old tear or injury, unspecified knee: Secondary | ICD-10-CM | POA: Diagnosis not present

## 2023-11-15 DIAGNOSIS — M1710 Unilateral primary osteoarthritis, unspecified knee: Secondary | ICD-10-CM | POA: Diagnosis not present

## 2023-11-15 DIAGNOSIS — Z6829 Body mass index (BMI) 29.0-29.9, adult: Secondary | ICD-10-CM | POA: Diagnosis not present

## 2024-01-01 DIAGNOSIS — M23203 Derangement of unspecified medial meniscus due to old tear or injury, right knee: Secondary | ICD-10-CM | POA: Diagnosis not present

## 2024-01-01 DIAGNOSIS — M1711 Unilateral primary osteoarthritis, right knee: Secondary | ICD-10-CM | POA: Diagnosis not present

## 2024-01-08 DIAGNOSIS — E1159 Type 2 diabetes mellitus with other circulatory complications: Secondary | ICD-10-CM | POA: Diagnosis not present

## 2024-01-08 DIAGNOSIS — E1129 Type 2 diabetes mellitus with other diabetic kidney complication: Secondary | ICD-10-CM | POA: Diagnosis not present

## 2024-01-08 DIAGNOSIS — E785 Hyperlipidemia, unspecified: Secondary | ICD-10-CM | POA: Diagnosis not present

## 2024-01-09 DIAGNOSIS — M1711 Unilateral primary osteoarthritis, right knee: Secondary | ICD-10-CM | POA: Diagnosis not present

## 2024-01-09 DIAGNOSIS — M23203 Derangement of unspecified medial meniscus due to old tear or injury, right knee: Secondary | ICD-10-CM | POA: Diagnosis not present

## 2024-01-15 DIAGNOSIS — E1129 Type 2 diabetes mellitus with other diabetic kidney complication: Secondary | ICD-10-CM | POA: Diagnosis not present

## 2024-01-15 DIAGNOSIS — Z23 Encounter for immunization: Secondary | ICD-10-CM | POA: Diagnosis not present

## 2024-01-15 DIAGNOSIS — I152 Hypertension secondary to endocrine disorders: Secondary | ICD-10-CM | POA: Diagnosis not present

## 2024-01-15 DIAGNOSIS — E785 Hyperlipidemia, unspecified: Secondary | ICD-10-CM | POA: Diagnosis not present

## 2024-01-15 DIAGNOSIS — E1159 Type 2 diabetes mellitus with other circulatory complications: Secondary | ICD-10-CM | POA: Diagnosis not present

## 2024-01-15 DIAGNOSIS — Z6829 Body mass index (BMI) 29.0-29.9, adult: Secondary | ICD-10-CM | POA: Diagnosis not present

## 2024-01-17 DIAGNOSIS — M1711 Unilateral primary osteoarthritis, right knee: Secondary | ICD-10-CM | POA: Diagnosis not present

## 2024-01-17 DIAGNOSIS — Z6829 Body mass index (BMI) 29.0-29.9, adult: Secondary | ICD-10-CM | POA: Diagnosis not present

## 2024-01-17 DIAGNOSIS — E1159 Type 2 diabetes mellitus with other circulatory complications: Secondary | ICD-10-CM | POA: Diagnosis not present

## 2024-01-17 DIAGNOSIS — I10 Essential (primary) hypertension: Secondary | ICD-10-CM | POA: Diagnosis not present

## 2024-01-17 DIAGNOSIS — I152 Hypertension secondary to endocrine disorders: Secondary | ICD-10-CM | POA: Diagnosis not present

## 2024-01-23 DIAGNOSIS — Z6829 Body mass index (BMI) 29.0-29.9, adult: Secondary | ICD-10-CM | POA: Diagnosis not present

## 2024-01-23 DIAGNOSIS — M5442 Lumbago with sciatica, left side: Secondary | ICD-10-CM | POA: Diagnosis not present
